# Patient Record
Sex: Female | Born: 1940 | Race: Black or African American | Hispanic: No | Marital: Married | Smoking: Never smoker
Health system: Southern US, Community
[De-identification: ages and names within clinical notes are randomized; demographics above are authoritative.]

## PROBLEM LIST (undated history)

## (undated) DIAGNOSIS — I10 Essential (primary) hypertension: Secondary | ICD-10-CM

## (undated) DIAGNOSIS — J302 Other seasonal allergic rhinitis: Secondary | ICD-10-CM

## (undated) DIAGNOSIS — M1990 Unspecified osteoarthritis, unspecified site: Secondary | ICD-10-CM

## (undated) HISTORY — PX: UTERINE FIBROID SURGERY: SHX826

## (undated) HISTORY — PX: JOINT REPLACEMENT: SHX530

---

## 1998-04-18 ENCOUNTER — Emergency Department (HOSPITAL_COMMUNITY): Admission: EM | Admit: 1998-04-18 | Discharge: 1998-04-18 | Payer: Self-pay | Admitting: Emergency Medicine

## 2000-11-09 ENCOUNTER — Other Ambulatory Visit: Admission: RE | Admit: 2000-11-09 | Discharge: 2000-11-09 | Payer: Self-pay | Admitting: Internal Medicine

## 2001-03-08 ENCOUNTER — Encounter: Admission: RE | Admit: 2001-03-08 | Discharge: 2001-03-08 | Payer: Self-pay

## 2001-09-26 ENCOUNTER — Other Ambulatory Visit: Admission: RE | Admit: 2001-09-26 | Discharge: 2001-09-26 | Payer: Self-pay | Admitting: Obstetrics and Gynecology

## 2001-09-28 ENCOUNTER — Ambulatory Visit (HOSPITAL_COMMUNITY): Admission: RE | Admit: 2001-09-28 | Discharge: 2001-09-28 | Payer: Self-pay | Admitting: Obstetrics and Gynecology

## 2001-09-28 ENCOUNTER — Encounter: Payer: Self-pay | Admitting: Obstetrics and Gynecology

## 2001-10-17 ENCOUNTER — Ambulatory Visit (HOSPITAL_COMMUNITY): Admission: RE | Admit: 2001-10-17 | Discharge: 2001-10-17 | Payer: Self-pay | Admitting: Obstetrics and Gynecology

## 2001-10-17 ENCOUNTER — Encounter: Payer: Self-pay | Admitting: Obstetrics and Gynecology

## 2002-03-27 ENCOUNTER — Encounter: Payer: Self-pay | Admitting: Specialist

## 2002-04-04 ENCOUNTER — Inpatient Hospital Stay (HOSPITAL_COMMUNITY): Admission: RE | Admit: 2002-04-04 | Discharge: 2002-04-09 | Payer: Self-pay | Admitting: Specialist

## 2004-10-23 ENCOUNTER — Ambulatory Visit: Payer: Self-pay | Admitting: Pulmonary Disease

## 2004-10-30 ENCOUNTER — Ambulatory Visit (HOSPITAL_COMMUNITY): Admission: RE | Admit: 2004-10-30 | Discharge: 2004-10-30 | Payer: Self-pay | Admitting: Gastroenterology

## 2005-10-04 ENCOUNTER — Other Ambulatory Visit: Admission: RE | Admit: 2005-10-04 | Discharge: 2005-10-04 | Payer: Self-pay | Admitting: Internal Medicine

## 2007-03-20 ENCOUNTER — Emergency Department (HOSPITAL_COMMUNITY): Admission: EM | Admit: 2007-03-20 | Discharge: 2007-03-20 | Payer: Self-pay | Admitting: Emergency Medicine

## 2007-04-05 ENCOUNTER — Encounter: Admission: RE | Admit: 2007-04-05 | Discharge: 2007-04-05 | Payer: Self-pay | Admitting: Internal Medicine

## 2007-04-19 ENCOUNTER — Other Ambulatory Visit: Admission: RE | Admit: 2007-04-19 | Discharge: 2007-04-19 | Payer: Self-pay | Admitting: Interventional Radiology

## 2007-04-19 ENCOUNTER — Encounter: Admission: RE | Admit: 2007-04-19 | Discharge: 2007-04-19 | Payer: Self-pay | Admitting: Internal Medicine

## 2007-04-19 ENCOUNTER — Encounter (INDEPENDENT_AMBULATORY_CARE_PROVIDER_SITE_OTHER): Payer: Self-pay | Admitting: Interventional Radiology

## 2007-04-20 ENCOUNTER — Encounter: Admission: RE | Admit: 2007-04-20 | Discharge: 2007-07-19 | Payer: Self-pay | Admitting: Orthopedic Surgery

## 2009-02-23 ENCOUNTER — Emergency Department (HOSPITAL_COMMUNITY): Admission: EM | Admit: 2009-02-23 | Discharge: 2009-02-23 | Payer: Self-pay | Admitting: Emergency Medicine

## 2011-02-05 NOTE — Discharge Summary (Signed)
[facility]  Patient:    KIZZI, OVERBEY Visit Number: [id number] MRN: [id number]          Service Type: SUR Location: 4W 0482 01 Attending Physician:  Rocky Crafts Dictated by:   Ralene Bathe, P.A. Admit Date:  04/04/2002 Discharge Date: 04/09/2002   CC:         Helene Kelp, M.D.   Discharge Summary  MEDICAL PHYSICIAN:  W. Chase Picket, M.D.  ADMISSION DIAGNOSES: 1. End-stage rheumatoid arthritis, right knee. 2. Gastroesophageal reflux disease. 3. Postmenopausal.  DISCHARGE DIAGNOSES: 1. End-stage rheumatoid arthritis, right knee, status post right knee    arthroplasty. 2. Gastroesophageal reflux disease. 3. Postmenopausal. 4. Postoperative hemorrhagic anemia, stable.  OPERATIONS:  Right total knee arthroplasty with postoperative epidural for pain control.  Surgeon:  [employer]. Montez Morita, M.D.  Assistant:  Georges Lynch. Gioffre, M.D.  CONSULTS:  None.  HISTORY OF PRESENT ILLNESS:  The patient is a 70 year old female who has end-stage rheumatoid arthritis and has failed outpatient conservative measures of the right knee.  At this time, she is having significant pain and disability and total knee arthroplasty is indicated.  The risks and benefits were discussed with the patient.  She was seen by her medical physicians and they were able to be stopped two weeks prior to surgery.  At this time, she wishes to proceed.  HOSPITAL COURSE:  The patient is admitted and underwent the above-named procedure.  She tolerated this well.  All appropriate IV antibiotics and analgesics.  Postoperatively, she had epidural pain control that was removed by anesthesia without sequelae.  She did extremely well.  She was placed on Coumadin for DVT and PE prophylaxis.  She was weaned to p.o. analgesics and began working with therapy.  All in all, she was noted to have a postoperative hemorrhagic anemia, but did not wish to have transfusion.  She dropped to  7.9 and was stable at 7.9 the following day and was tolerating this with her activities.  On April 09, 2002, she had met all of her therapy goals.  The patient was afebrile.  Her incision was clean and dry.  At this time, she was stable for discharge to home on outpatient iron and other medicines per routine.  LABORATORY DATA:  The admission hemoglobin was 12.3 and postoperatively down to 7.9 on two blood draws, stable.  Pro times and INRs were monitored by pharmacy.  Preoperative chemistries within normal limits.  Urinalysis also normal.  The blood type is O+.  RADIOLOGY:  A chest x-ray with no evidence of acute disease.  Severe rheumatoid arthritis noted in the knee.  The EKG showed normal sinus rhythm and occasional PVCs.  CONDITION ON DISCHARGE:  Stable and improved.  DISPOSITION:  The patient is being discharged to home.  Home PT and R.N. are arranged.  DISCHARGE MEDICATIONS:  She is on Coumadin per protocol.  She is also on Percocet.  She had previously had an allergy to this, however, tolerated this medicine without problems during the hospitalization.  She is to resume her other medications at home.  She was also given Skelaxin 400 mg, #60, one to two every six hours p.r.n. spasm.  Again, Percocet 5/325 mg, #40, one to two every four to six hours p.r.n. pain and Trinsicon one t.i.d.  FOLLOW-UP:  Monitor for any further symptoms of anemia.  Have her follow up in two weeks.  WOUND CARE:  May shower at this time. Dictated by:   Ralene Bathe,  P.A. Attending Physician:  Rocky Crafts DD:  04/09/02 TD:  04/13/02 Job: [id number]

## 2011-02-05 NOTE — Op Note (Signed)
[facility]  Patient:    Lindsey Flynn, Lindsey Flynn Visit Number: [id number] MRN: [id number]          Service Type: SUR Location: 4W 0482 01 Attending Physician:  Rocky Crafts Dictated by:   Michael Litter. Montez Morita, M.D. Proc. Date: 04/04/02 Admit Date:  04/04/2002                             Operative Report  SURGEON:  Philips J. Montez Morita, M.D.  ASSISTANT:  Georges Lynch. Darrelyn Hillock, M.D.  PREOPERATIVE DIAGNOSIS:  Rheumatoid arthritis, right knee with severe degenerative component as well on the lateral side.  POSTOPERATIVE DIAGNOSIS:  Rheumatoid arthritis, right knee with severe degenerative component as well on the lateral side.  OPERATIVE PROCEDURE:  Right total knee arthroplasty using an [employer] Scorpio system, posterior cruciate sacrificing with a size 9 femur, a size 9 tibia, a 12 mm tibial tray, and a 26 mm recessed patella.  Two grams of vancomycin were mixed in two batches of the radiopaque Simplex cement.  DESCRIPTION OF PROCEDURE:  After suitable general anesthesia, the right leg is prepped and draped routinely and, after exsanguination, an upper thigh tourniquet is inflated to 300 mmHg.  Midline incision is made, followed by a lateral parapatellar incision.  An osteotome is used to free up the lateral half of the patella for easier exposure.  On the exposure, a capsular incision is made high and a synovial incision low so that should there be need of a lateral release, these could be sutured together to close the joint.  Severe synovial disease was debrided.  There was some medial laxity which is another reason for the lateral approach.  Spurs and osteophytes were removed, a very narrow notch in the femur.  A central guidepin for the femur, followed by the rod and a 5 degree valgus cut was put into the knee, taking of 10 mm of distal femur.  The new device was brought in, and it was felt that she was in about 4.5 degrees of external rotation.   Elected to use a 9 and then to elevate 2 mm so as to avoid any significant cutting into the femur and using that sizing guide, the anterior and posterior and chamfering cuts were made on the femur for a size 9 prosthesis.  Tibial spines were removed.  Remnants of the menisci were trimmed out.  The cruciates were released.  The tibia was brought forward.  A size 9 was thought to be good.  Central drill hole made, followed by the guide rod, elected to take 10 mm off of the normal side which is the medial size which really was not normal, and that looked to be a bit thick. Dropped that down to about 8 mm and made a 0 degree cut in this rheumatoid with no posterior slope.  Following that, we cleared the back of the femur of osteophytes and remnants of soft tissue and synovium, always using the Bovie as cutting device and to coagulate as well.  The notches were then cut from the femur for the patellar groove in the notch, after which a trial femur was slipped on, a size 9, and we could get a 10 mm tibial tray in, but I wanted a little thicker tray, so we went ahead and cut two more off the tibia so it would handle a 12.  The 12 trial was put in, and the points were marked for the  cutting device to cut the fins for the prosthesis.  Then before doing that, the patella was reamed; 26 mm patella with a recessed patella was reamed down 10 mm, and the holes were reamed in for it as well.  The devices were then all removed.  The tibia was subluxed forward and tibial fin cutting guide was brought in and cut to accommodate the size 9 tibia with the fins.  The wound was then thoroughly irrigated.  Cement was mixed with the vancomycin and cemented to the femur, tibia, and then patella and held until set.  Cement removed off the back of the patella.  Some bone had been impacted up into the femoral groove.  Some bone wax was put over the femoral groove area. Tourniquet was then let down.  Hemostasis secured  after which there was no major bleeding.  The regular 12 tray was inserted and the wound then closed in layers, reattaching the capsule in its normal position laterally.  Without having the need of a lateral release, the patella worked quite well in the groove.  A subcuticular suture in the skin of 3-0 PDS with Steri-Strips.  Nice compression dressing.  Hemovac.  She goes to recovery in good condition. Dictated by:   [employer] Montez Morita, M.D. Attending Physician:  Rocky Crafts DD:  04/04/02 TD:  04/07/02 Job: [id number]

## 2011-02-05 NOTE — H&P (Signed)
[facility]  Patient:    Lindsey Flynn, Lindsey Flynn Visit Number: [id number] MRN: [id number]          Service Type: Attending:  Philips J. Montez Morita, M.D. Dictated by:   Dorie Rank, P.A. Adm. Date:  04/04/02                           History and Physical  DATE OF BIRTH:  1940-12-06.  CHIEF COMPLAINT:  Right knee pain.  HISTORY OF PRESENT ILLNESS:  The patient is a pleasant 70 year old female without has had a history of rheumatoid arthritis since she was a young adult. She has had continued right knee pain despite conservative treatment, severe x2 to 3 years.  Radiographs revealed degenerative changes to the right knee with bone on bone contacts, spurring, and cyst formation.  The pain was to the point where it was interfering with her activities of daily living.  It was felt she would benefit from undergoing a right total knee arthroplasty.  The risks, benefits, as well as the procedures were discussed with the patient. She elected to proceed.  She did obtain a medical clearance from [facility].  ALLERGIES:  She reports that PERCOCET and DEMEROL make her "hyper."  MEDICATIONS: 1. Arava 20 mg 1 p.o. three times weekly. 2. Estradiol 2 mg 1 p.o. q. day. 3. It was recommended by [facility] that she should not    take her arava 2 weeks before and 2 weeks after surgery.  PAST MEDICAL HISTORY: 1. Significant for rheumatoid arthritis. 2. Gastroesophageal reflux disease. 3. Menopausal.  SOCIAL HISTORY:  The patient is married.  She is retired.  She denies any alcohol or tobacco use.  She has two children.  She lives in a 1 level home.  SURGICAL HISTORY:  Left knee arthroscopy and hysterectomy in the distant past.  FAMILY HISTORY:  She has a sister with breast cancer.  Mother deceased age 31. History of Alzheimers.  Father deceased age unknown.  A history of hypertension.  REVIEW OF SYSTEMS:  General:  No fevers,  chills, night sweats.  Pulmonary: Occasional dyspnea on recumbency.  She attributes this to a recent weight gain.  Cardiovascular:  No chest pain, angina or orthopnea.  Endocrine:  No history of hyper or hypothyroidism.  No diabetes mellitus.  GI:  No nausea, vomiting, diarrhea or constipation.  GU:  No hematuria, dysuria or discharge. Psychologic:  No history of anxiety or depression.  Musculoskeletal:  Multiple joint pain secondary to rheumatoid arthritis.  PHYSICAL EXAMINATION:  GENERAL:  Alert, oriented, well-developed, well-nourished 70 year old female appears stated age.  VITAL SIGNS:  Respirations 20, pulse 70, blood pressure 130/90.  HEENT:  Head is normocephalic and atraumatic.  Oropharynx is clear.  NECK:  Supple, negative for cervical lymphadenopathy.  CHEST:  Clear to auscultation bilaterally.  No wheezes, rhonchis or rales.  BREASTS:  No pertinent to present illness.  HEART:  S1, S2.  Negative for murmur, rub or gallop.  Regular rate and rhythm.  ABDOMEN:  Soft, nontender, positive bowel sounds.  GU:  Not pertinent to present illness.  EXTREMITIES:  She has crepitus on range of motion to her right knee.  She walks with an antalgic gait.  SKIN:  Intact.  No rashes or lesions appreciated on examination.  LABORATORY DATA AND X-RAYS:  Preoperative labs and x-rays are pending.  IMPRESSION:  Rheumatoid arthritis to the right knee.  PLAN:  A right total knee  arthroplasty with Dr. Ronnell Guadalajara.  She does voice understanding and it is felt this is anticipated for upcoming surgery. Dictated by:   Dorie Rank, P.A. Attending:  Philips J. Montez Morita, M.D. DD:  03/27/02 TD:  03/29/02 Job: [id number]

## 2011-02-05 NOTE — Op Note (Signed)
NAME:  Lindsey Flynn, Lindsey Flynn                ACCOUNT NO.:  [id number]   MEDICAL RECORD NO.:  [id number]          PATIENT TYPE:  AMB   LOCATION:  ENDO                         FACILITY:  [facility]   PHYSICIAN:  Anselmo Rod, M.D.  DATE OF BIRTH:  1941/04/25   DATE OF PROCEDURE:  10/30/2004  DATE OF DISCHARGE:                                 OPERATIVE REPORT   PROCEDURE:  Screening colonoscopy.   ENDOSCOPIST:  Anselmo Rod, M.D.   INSTRUMENT:  Olympus video colonoscope.   INDICATIONS FOR PROCEDURE:  A 70 year old African-American female underwent  screening colonoscopy to rule out colonic polyps, masses, etc.   PREPROCEDURE PREPARATION:  Informed consent was procured from the patient.  The patient was fasted for 8 hours prior to the procedure and prepped with a  bottle of magnesium citrate in a gallon of GoLYTELY the night prior to the  procedure.  The risks and benefits of the procedure including a 10% miss  rate of cancers and polyps were discussed with her as well.   PREPROCEDURE PHYSICAL:  VITAL SIGNS: The patient with stable vital signs.  NECK:  Supple.  CHEST:  Clear to auscultation.  HEART:  S1-S2 regular.  ABDOMEN:  Soft with normal bowel sounds.   DESCRIPTION OF PROCEDURE:  The patient was placed in the left lateral  decubitus position and sedated with 70 mg of Demerol and 7.5 mg of Versed in  incremental doses.  Once the patient was adequately sedated, maintained on  low-flow oxygen, and continuous cardiac monitoring; the Olympus video  colonoscope was advanced into the rectum, into the cecum, ileocecal valve  were easily reached and photographed.  No masses, polyps, erosions,  ulcerations or diverticula were seen.  Small internal hemorrhoids were  appreciated on retroflexion.   The patient tolerated the procedure well without immediate complications.   IMPRESSION:  Normal colonoscopy up to the cecum except for small internal  hemorrhoids.  No masses, polyps, or diverticula  were seen.   RECOMMENDATIONS:  1.  A repeat colonoscopy is recommended in the next 5 years unless the      patient develops abnormal symptoms in      the interim.  2.  High fiber diet with liberal fluid intake has been advised.  3.  Outpatient follow up as need arises in the future.      JNM/MEDQ  D:  10/30/2004  T:  11/01/2004  Job:  [id number]   cc:   Georgianne Fick, M.D.  [address]  [place]  [place] [address]  Fax: [phone]   Garth Schlatter, M.D.

## 2011-07-07 LAB — POCT CARDIAC MARKERS: Troponin i, poc: <0.05

## 2012-02-04 ENCOUNTER — Emergency Department (HOSPITAL_COMMUNITY)
Admission: EM | Admit: 2012-02-04 | Discharge: 2012-02-04 | Disposition: A | Discharge status: Home / Self Care | Payer: Medicare Other | Attending: Emergency Medicine | Admitting: Emergency Medicine

## 2012-02-04 ENCOUNTER — Emergency Department (HOSPITAL_COMMUNITY): Payer: Medicare Other

## 2012-02-04 ENCOUNTER — Encounter (HOSPITAL_COMMUNITY): Payer: Self-pay

## 2012-02-04 DIAGNOSIS — M129 Arthropathy, unspecified: Secondary | ICD-10-CM | POA: Insufficient documentation

## 2012-02-04 DIAGNOSIS — Z96659 Presence of unspecified artificial knee joint: Secondary | ICD-10-CM | POA: Insufficient documentation

## 2012-02-04 DIAGNOSIS — Z79899 Other long term (current) drug therapy: Secondary | ICD-10-CM | POA: Insufficient documentation

## 2012-02-04 DIAGNOSIS — W010XXA Fall on same level from slipping, tripping and stumbling without subsequent striking against object, initial encounter: Secondary | ICD-10-CM | POA: Insufficient documentation

## 2012-02-04 DIAGNOSIS — M25569 Pain in unspecified knee: Secondary | ICD-10-CM | POA: Insufficient documentation

## 2012-02-04 DIAGNOSIS — IMO0002 Reserved for concepts with insufficient information to code with codable children: Secondary | ICD-10-CM | POA: Insufficient documentation

## 2012-02-04 DIAGNOSIS — I10 Essential (primary) hypertension: Secondary | ICD-10-CM | POA: Insufficient documentation

## 2012-02-04 HISTORY — DX: Unspecified osteoarthritis, unspecified site: M19.90

## 2012-02-04 HISTORY — DX: Essential (primary) hypertension: I10

## 2012-02-04 MED ORDER — HYDROCODONE-ACETAMINOPHEN 5-500 MG PO TABS: 1.0000 | ORAL_TABLET | Freq: Four times a day (QID) | ORAL | Status: AC | PRN

## 2012-02-04 MED ORDER — IBUPROFEN 200 MG PO TABS
600.0000 mg | ORAL_TABLET | Freq: Once | ORAL | Status: AC
  Administered 2012-02-04: 600 mg via ORAL
  Filled 2012-02-04: qty 3

## 2012-02-04 MED ORDER — IBUPROFEN 400 MG PO TABS: 600.0000 mg | ORAL_TABLET | Freq: Four times a day (QID) | ORAL | Status: AC | PRN

## 2012-02-04 NOTE — ED Notes (Addendum)
Per EMS, pt slipped on grapes and fell in grocery store, landed on knee, now with right knee pain.  Pt also reports h/o pain to same knee.  Fall was witnessed, pt did not hit head or have any other injuries.

## 2012-02-04 NOTE — ED Notes (Signed)
Patient reports that she slipped on grapes in a grocery store landing on both knees,but right knee hurts worse. Patient reports limited weightbearing due to pain.

## 2012-02-04 NOTE — ED Provider Notes (Signed)
History     CSN: [id number]  Arrival date & time 02/04/12  1539   First MD Initiated Contact with Patient 02/04/12 1626      Chief Complaint  Patient presents with  . Knee Pain  . Fall    (Consider location/radiation/quality/duration/timing/severity/associated sxs/prior treatment) Patient is a 71 y.o. female presenting with fall. The history is provided by the patient.  Fall The accident occurred 1 to 2 hours ago. The fall occurred while walking. She landed on a hard floor. The point of impact was the left knee and right knee. The pain is present in the left knee and right knee. The pain is at a severity of 7/10. The pain is moderate. She was ambulatory at the scene. There was no entrapment after the fall.  Pt states she fell today after slipping on a grape at a grocery store. States pain in bilateral knees, states fell on top of them. States hx of right knee replacement 10 years ago. Hx of rheumatoid arthritis. No other complaints. Did not hit her head. No LOC. States unable to put any weight on it, pt did ambulate some, but states it is unbearable.   Past Medical History  Diagnosis Date  . Hypertension   . Arthritis     Past Surgical History  Procedure Date  . Joint replacement   . Uterine fibroid surgery     Family History  Problem Relation Age of Onset  . Cancer Sister   . Diabetes Other     History  Substance Use Topics  . Smoking status: Never Smoker   . Smokeless tobacco: Never Used  . Alcohol Use: No    OB History    Grav Para Term Preterm Abortions TAB SAB Ect Mult Living                  Review of Systems  Allergies  Review of patient's allergies indicates no known allergies.  Home Medications   Current Outpatient Rx  Name Route Sig Dispense Refill  . ESTRADIOL 2 MG PO TABS Oral Take 2 mg by mouth daily.    Marland Kitchen LISINOPRIL-HYDROCHLOROTHIAZIDE 20-12.5 MG PO TABS Oral Take 1 tablet by mouth daily.      BP 150/85  Pulse 83  Temp(Src) 99.1 F  (37.3 C) (Oral)  Resp 18  SpO2 100%  Physical Exam  Nursing note and vitals reviewed. Constitutional: She is oriented to person, place, and time. She appears well-developed and well-nourished.  HENT:  Head: Normocephalic and atraumatic.  Eyes: Conjunctivae are normal.  Neck: Neck supple.  Cardiovascular: Normal rate, regular rhythm and normal heart sounds.   Pulmonary/Chest: Effort normal and breath sounds normal. No respiratory distress.  Musculoskeletal:       Normal appearing bilateral knees. Right knee has large healed surgical incision through it from prior knee replacement. Tender over bilateral patellas. Patella tendons intact. Full ROM bilaterally on both knee joints, however, pain with full extension and full flexion. Knee joints are stable with negative anterior and posterior drawer signs.   Neurological: She is alert and oriented to person, place, and time.  Skin: Skin is warm and dry.  Psychiatric: She has a normal mood and affect.    ED Course  Procedures (including critical care time)  Pt fell on her bilat knees. Pain with bearing weight. Will get x-rays.   Dg Knee Complete 4 Views Left  02/04/2012  *RADIOLOGY REPORT*  Clinical Data: Post fall landing on knees, anterior knee pain bilaterally, history arthritis  LEFT KNEE - COMPLETE 4+ VIEW  Comparison: None  Findings: Osseous demineralization. Diffuse joint space narrowing and marginal spur formation. No acute fracture, dislocation or bone destruction. No definite knee joint effusion.  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 No acute abnormalities.  Original Report Authenticated By: Lollie Marrow, M.D.   Dg Knee Complete 4 Views Right  02/04/2012  *RADIOLOGY REPORT*  Clinical Data: Larey Seat landing on knees, bilateral anterior knee pain  RIGHT KNEE - COMPLETE 4+ VIEW  Comparison: 03/20/2007  Findings: Components of right knee prosthesis in expected positions. Bones appear  demineralized. No periprosthetic lucency. Minimal bony debris posteriorly. No acute fracture, dislocation or bone destruction. Spurring at inferior pole patella. No definite knee joint effusion.  IMPRESSION: Osseous demineralization. Right knee prosthesis. No acute abnormalities.  Original Report Authenticated By: Lollie Marrow, M.D.    X-rays negative. Will treat with pain medications. Pt refusing meds in ED. Pt ambulated. Will d/c home. I do not suspect major ligament injury. Joints are stable.   1. Knee sprain and strain bilateral       MDM          Lottie Mussel, PA 02/05/12 [phone]

## 2012-02-04 NOTE — Discharge Instructions (Signed)
Your x-ray look normal. Ice your knees at home. Stay off of them. Keep elevated. Ibuprofen for pain. Vocodin as prescribed as needed for severe pain, do not drive if taking. Follow up with orthopedics as referred if not improving.   Knee Pain The knee is the complex joint between your thigh and your lower leg. It is made up of bones, tendons, ligaments, and cartilage. The bones that make up the knee are:  The femur in the thigh.   The tibia and fibula in the lower leg.   The patella or kneecap riding in the groove on the lower femur.  CAUSES  Knee pain is a common complaint with many causes. A few of these causes are:  Injury, such as:   A ruptured ligament or tendon injury.   Torn cartilage.   Medical conditions, such as:   Gout   Arthritis   Infections   Overuse, over training or overdoing a physical activity.  Knee pain can be minor or severe. Knee pain can accompany debilitating injury. Minor knee problems often respond well to self-care measures or get well on their own. More serious injuries may need medical intervention or even surgery. SYMPTOMS The knee is complex. Symptoms of knee problems can vary widely. Some of the problems are:  Pain with movement and weight bearing.   Swelling and tenderness.   Buckling of the knee.   Inability to straighten or extend your knee.   Your knee locks and you cannot straighten it.   Warmth and redness with pain and fever.   Deformity or dislocation of the kneecap.  DIAGNOSIS  Determining what is wrong may be very straight forward such as when there is an injury. It can also be challenging because of the complexity of the knee. Tests to make a diagnosis may include:  Your caregiver taking a history and doing a physical exam.   Routine X-rays can be used to rule out other problems. X-rays will not reveal a cartilage tear. Some injuries of the knee can be diagnosed by:   Arthroscopy a surgical technique by which a small video  camera is inserted through tiny incisions on the sides of the knee. This procedure is used to examine and repair internal knee joint problems. Tiny instruments can be used during arthroscopy to repair the torn knee cartilage (meniscus).   Arthrography is a radiology technique. A contrast liquid is directly injected into the knee joint. Internal structures of the knee joint then become visible on X-ray film.   An MRI scan is a non x-ray radiology procedure in which magnetic fields and a computer produce two- or three-dimensional images of the inside of the knee. Cartilage tears are often visible using an MRI scanner. MRI scans have largely replaced arthrography in diagnosing cartilage tears of the knee.   Blood work.   Examination of the fluid that helps to lubricate the knee joint (synovial fluid). This is done by taking a sample out using a needle and a syringe.  TREATMENT The treatment of knee problems depends on the cause. Some of these treatments are:  Depending on the injury, proper casting, splinting, surgery or physical therapy care will be needed.   Give yourself adequate recovery time. Do not overuse your joints. If you begin to get sore during workout routines, back off. Slow down or do fewer repetitions.   For repetitive activities such as cycling or running, maintain your strength and nutrition.   Alternate muscle groups. For example if you are  a weight lifter, work the upper body on one day and the lower body the next.   Either tight or weak muscles do not give the proper support for your knee. Tight or weak muscles do not absorb the stress placed on the knee joint. Keep the muscles surrounding the knee strong.   Take care of mechanical problems.   If you have flat feet, orthotics or special shoes may help. See your caregiver if you need help.   Arch supports, sometimes with wedges on the inner or outer aspect of the heel, can help. These can shift pressure away from the side  of the knee most bothered by osteoarthritis.   A brace called an "unloader" brace also may be used to help ease the pressure on the most arthritic side of the knee.   If your caregiver has prescribed crutches, braces, wraps or ice, use as directed. The acronym for this is PRICE. This means protection, rest, ice, compression and elevation.   Nonsteroidal anti-inflammatory drugs (NSAID's), can help relieve pain. But if taken immediately after an injury, they may actually increase swelling. Take NSAID's with food in your stomach. Stop them if you develop stomach problems. Do not take these if you have a history of ulcers, stomach pain or bleeding from the bowel. Do not take without your caregiver's approval if you have problems with fluid retention, heart failure, or kidney problems.   For ongoing knee problems, physical therapy may be helpful.   Glucosamine and chondroitin are over-the-counter dietary supplements. Both may help relieve the pain of osteoarthritis in the knee. These medicines are different from the usual anti-inflammatory drugs. Glucosamine may decrease the rate of cartilage destruction.   Injections of a corticosteroid drug into your knee joint may help reduce the symptoms of an arthritis flare-up. They may provide pain relief that lasts a few months. You may have to wait a few months between injections. The injections do have a small increased risk of infection, water retention and elevated blood sugar levels.   Hyaluronic acid injected into damaged joints may ease pain and provide lubrication. These injections may work by reducing inflammation. A series of shots may give relief for as long as 6 months.   Topical painkillers. Applying certain ointments to your skin may help relieve the pain and stiffness of osteoarthritis. Ask your pharmacist for suggestions. Many over the-counter products are approved for temporary relief of arthritis pain.   In some countries, doctors often  prescribe topical NSAID's for relief of chronic conditions such as arthritis and tendinitis. A review of treatment with NSAID creams found that they worked as well as oral medications but without the serious side effects.  PREVENTION  Maintain a healthy weight. Extra pounds put more strain on your joints.   Get strong, stay limber. Weak muscles are a common cause of knee injuries. Stretching is important. Include flexibility exercises in your workouts.   Be smart about exercise. If you have osteoarthritis, chronic knee pain or recurring injuries, you may need to change the way you exercise. This does not mean you have to stop being active. If your knees ache after jogging or playing basketball, consider switching to swimming, water aerobics or other low-impact activities, at least for a few days a week. Sometimes limiting high-impact activities will provide relief.   Make sure your shoes fit well. Choose footwear that is right for your sport.   Protect your knees. Use the proper gear for knee-sensitive activities. Use kneepads when playing volleyball  or laying carpet. Buckle your seat belt every time you drive. Most shattered kneecaps occur in car accidents.   Rest when you are tired.  SEEK MEDICAL CARE IF:  You have knee pain that is continual and does not seem to be getting better.  SEEK IMMEDIATE MEDICAL CARE IF:  Your knee joint feels hot to the touch and you have a high fever. MAKE SURE YOU:   Understand these instructions.   Will watch your condition.   Will get help right away if you are not doing well or get worse.  Document Released: 07/04/2007 Document Revised: 08/26/2011 Document Reviewed: 07/04/2007 [facility] Patient Information 2012 [place], [place].

## 2012-02-07 NOTE — ED Provider Notes (Signed)
Medical screening examination/treatment/procedure(s) were conducted as a shared visit with non-physician practitioner(s) and myself.  I personally evaluated the patient during the encounter S/p slip at store. Contusion knee. Mild swelling, knee stable.   Suzi Roots, MD 02/07/12 [phone]

## 2014-01-14 ENCOUNTER — Emergency Department (HOSPITAL_COMMUNITY): Payer: No Typology Code available for payment source

## 2014-01-14 ENCOUNTER — Encounter (HOSPITAL_COMMUNITY): Payer: Self-pay | Admitting: Radiology

## 2014-01-14 ENCOUNTER — Encounter (HOSPITAL_COMMUNITY)
Admission: EM | Disposition: A | Discharge status: Skilled Nursing Facility | Payer: Self-pay | Source: Home / Self Care | Attending: Orthopedic Surgery

## 2014-01-14 ENCOUNTER — Inpatient Hospital Stay (HOSPITAL_COMMUNITY)
Admission: EM | Admit: 2014-01-14 | Discharge: 2014-01-23 | DRG: 480 | Disposition: A | Discharge status: Skilled Nursing Facility | Payer: No Typology Code available for payment source | Attending: Orthopedic Surgery | Admitting: Orthopedic Surgery

## 2014-01-14 DIAGNOSIS — Y831 Surgical operation with implant of artificial internal device as the cause of abnormal reaction of the patient, or of later complication, without mention of misadventure at the time of the procedure: Secondary | ICD-10-CM | POA: Diagnosis present

## 2014-01-14 DIAGNOSIS — W208XXA Other cause of strike by thrown, projected or falling object, initial encounter: Secondary | ICD-10-CM | POA: Diagnosis present

## 2014-01-14 DIAGNOSIS — T84049A Periprosthetic fracture around unspecified internal prosthetic joint, initial encounter: Secondary | ICD-10-CM | POA: Diagnosis present

## 2014-01-14 DIAGNOSIS — S72409B Unspecified fracture of lower end of unspecified femur, initial encounter for open fracture type I or II: Secondary | ICD-10-CM | POA: Diagnosis present

## 2014-01-14 DIAGNOSIS — R339 Retention of urine, unspecified: Secondary | ICD-10-CM | POA: Diagnosis not present

## 2014-01-14 DIAGNOSIS — Z9660 Presence of unspecified orthopedic joint implant: Principal | ICD-10-CM | POA: Diagnosis present

## 2014-01-14 DIAGNOSIS — S72401B Unspecified fracture of lower end of right femur, initial encounter for open fracture type I or II: Secondary | ICD-10-CM | POA: Diagnosis present

## 2014-01-14 DIAGNOSIS — M129 Arthropathy, unspecified: Secondary | ICD-10-CM | POA: Diagnosis present

## 2014-01-14 DIAGNOSIS — I10 Essential (primary) hypertension: Secondary | ICD-10-CM | POA: Diagnosis present

## 2014-01-14 DIAGNOSIS — Z96659 Presence of unspecified artificial knee joint: Secondary | ICD-10-CM

## 2014-01-14 DIAGNOSIS — S7291XB Unspecified fracture of right femur, initial encounter for open fracture type I or II: Secondary | ICD-10-CM

## 2014-01-14 DIAGNOSIS — S7290XB Unspecified fracture of unspecified femur, initial encounter for open fracture type I or II: Secondary | ICD-10-CM | POA: Diagnosis present

## 2014-01-14 DIAGNOSIS — D62 Acute posthemorrhagic anemia: Secondary | ICD-10-CM | POA: Diagnosis not present

## 2014-01-14 DIAGNOSIS — M810 Age-related osteoporosis without current pathological fracture: Secondary | ICD-10-CM | POA: Diagnosis present

## 2014-01-14 DIAGNOSIS — M25569 Pain in unspecified knee: Secondary | ICD-10-CM | POA: Diagnosis present

## 2014-01-14 HISTORY — PX: IM NAILING FEMORAL SHAFT FRACTURE: SUR731

## 2014-01-14 HISTORY — DX: Other seasonal allergic rhinitis: J30.2

## 2014-01-14 HISTORY — PX: FEMUR IM NAIL: SHX1597

## 2014-01-14 LAB — COMPREHENSIVE METABOLIC PANEL
ALT: 9 U/L (ref 0–35)
AST: 16 U/L (ref 0–37)
Albumin: 3.3 g/dL — ABNORMAL LOW (ref 3.5–5.2)
Alkaline Phosphatase: 91 U/L (ref 39–117)
BUN: 18 mg/dL (ref 6–23)
CALCIUM: 9.1 mg/dL (ref 8.4–10.5)
CO2: 27 mEq/L (ref 19–32)
CREATININE: 0.81 mg/dL (ref 0.50–1.10)
Chloride: 101 mEq/L (ref 96–112)
GFR calc non Af Amer: 72 mL/min — ABNORMAL LOW (ref >90)
GFR, EST AFRICAN AMERICAN: 83 mL/min — AB (ref >90)
GLUCOSE: 134 mg/dL — AB (ref 70–99)
Potassium: 4.3 mEq/L (ref 3.7–5.3)
Sodium: 140 mEq/L (ref 137–147)
TOTAL PROTEIN: 7.6 g/dL (ref 6.0–8.3)
Total Bilirubin: 0.2 mg/dL — ABNORMAL LOW (ref 0.3–1.2)

## 2014-01-14 LAB — CBC
HEMATOCRIT: 36.8 % (ref 36.0–46.0)
HEMOGLOBIN: 11.4 g/dL — AB (ref 12.0–15.0)
MCH: 27.1 pg (ref 26.0–34.0)
MCHC: 31 g/dL (ref 30.0–36.0)
MCV: 87.6 fL (ref 78.0–100.0)
Platelets: 246 10*3/uL (ref 150–400)
RBC: 4.2 MIL/uL (ref 3.87–5.11)
RDW: 15.3 % (ref 11.5–15.5)
WBC: 9.7 10*3/uL (ref 4.0–10.5)

## 2014-01-14 LAB — SAMPLE TO BLOOD BANK

## 2014-01-14 LAB — PROTIME-INR
INR: 0.94 (ref 0.00–1.49)
Prothrombin Time: 12.4 seconds (ref 11.6–15.2)

## 2014-01-14 SURGERY — INSERTION, INTRAMEDULLARY ROD, FEMUR, RETROGRADE
Anesthesia: General | Site: Leg Upper | Laterality: Right

## 2014-01-14 MED ORDER — SUCCINYLCHOLINE CHLORIDE 20 MG/ML IJ SOLN
INTRAMUSCULAR | Status: AC
  Filled 2014-01-14: qty 1

## 2014-01-14 MED ORDER — ONDANSETRON HCL 4 MG/2ML IJ SOLN
INTRAMUSCULAR | Status: AC
  Filled 2014-01-14: qty 2

## 2014-01-14 MED ORDER — TETANUS-DIPHTH-ACELL PERTUSSIS 5-2.5-18.5 LF-MCG/0.5 IM SUSP
0.5000 mL | Freq: Once | INTRAMUSCULAR | Status: AC
  Administered 2014-01-14: 0.5 mL via INTRAMUSCULAR
  Filled 2014-01-14: qty 0.5

## 2014-01-14 MED ORDER — LIDOCAINE HCL (CARDIAC) 20 MG/ML IV SOLN
INTRAVENOUS | Status: AC
  Filled 2014-01-14: qty 5

## 2014-01-14 MED ORDER — HYDROMORPHONE HCL PF 1 MG/ML IJ SOLN
1.0000 mg | INTRAMUSCULAR | Status: AC | PRN
  Administered 2014-01-14: 1 mg via INTRAVENOUS
  Administered 2014-01-14 (×2): 0.5 mg via INTRAVENOUS
  Filled 2014-01-14 (×2): qty 1

## 2014-01-14 MED ORDER — ONDANSETRON HCL 4 MG/2ML IJ SOLN
4.0000 mg | Freq: Once | INTRAMUSCULAR | Status: AC
  Administered 2014-01-14: 4 mg via INTRAVENOUS

## 2014-01-14 MED ORDER — CEFAZOLIN SODIUM-DEXTROSE 2-3 GM-% IV SOLR
2.0000 g | Freq: Once | INTRAVENOUS | Status: AC
  Administered 2014-01-14: 2 g via INTRAVENOUS
  Filled 2014-01-14: qty 50

## 2014-01-14 MED ORDER — HYDROMORPHONE HCL PF 1 MG/ML IJ SOLN
1.0000 mg | Freq: Once | INTRAMUSCULAR | Status: AC
  Administered 2014-01-14: 1 mg via INTRAVENOUS

## 2014-01-14 MED ORDER — HYDROMORPHONE HCL PF 1 MG/ML IJ SOLN
1.0000 mg | Freq: Once | INTRAMUSCULAR | Status: AC
  Administered 2014-01-14: 1 mg via INTRAVENOUS
  Filled 2014-01-14: qty 1

## 2014-01-14 MED ORDER — LACTATED RINGERS IV SOLN
INTRAVENOUS | Status: DC | PRN
  Administered 2014-01-14 – 2014-01-15 (×2): via INTRAVENOUS

## 2014-01-14 MED ORDER — ARTIFICIAL TEARS OP OINT
TOPICAL_OINTMENT | OPHTHALMIC | Status: AC
  Filled 2014-01-14: qty 3.5

## 2014-01-14 MED ORDER — HYDROMORPHONE HCL PF 1 MG/ML IJ SOLN
INTRAMUSCULAR | Status: AC
  Filled 2014-01-14: qty 1

## 2014-01-14 MED ORDER — PHENYLEPHRINE 40 MCG/ML (10ML) SYRINGE FOR IV PUSH (FOR BLOOD PRESSURE SUPPORT)
PREFILLED_SYRINGE | INTRAVENOUS | Status: AC
  Filled 2014-01-14: qty 10

## 2014-01-14 MED ORDER — FENTANYL CITRATE 0.05 MG/ML IJ SOLN
INTRAMUSCULAR | Status: AC
  Filled 2014-01-14: qty 5

## 2014-01-14 SURGICAL SUPPLY — 71 items
BANDAGE ELASTIC 4 VELCRO ST LF (GAUZE/BANDAGES/DRESSINGS) ×2 IMPLANT
BANDAGE ELASTIC 6 VELCRO ST LF (GAUZE/BANDAGES/DRESSINGS) ×2 IMPLANT
BANDAGE ESMARK 6X9 LF (GAUZE/BANDAGES/DRESSINGS) IMPLANT
BANDAGE GAUZE ELAST BULKY 4 IN (GAUZE/BANDAGES/DRESSINGS) ×2 IMPLANT
BIT DRILL CALIBRATED 4.3MMX365 (DRILL) ×1 IMPLANT
BIT DRILL CROWE PNT TWST 4.5MM (DRILL) ×1 IMPLANT
BLADE 10 SAFETY STRL DISP (BLADE) ×2 IMPLANT
BLADE SURG 15 STRL LF DISP TIS (BLADE) ×1 IMPLANT
BLADE SURG 15 STRL SS (BLADE) ×2
BLADE SURG ROTATE 9660 (MISCELLANEOUS) IMPLANT
BNDG COHESIVE 6X5 TAN STRL LF (GAUZE/BANDAGES/DRESSINGS) ×2 IMPLANT
BNDG ESMARK 6X9 LF (GAUZE/BANDAGES/DRESSINGS)
CONT SPEC 4OZ CLIKSEAL STRL BL (MISCELLANEOUS) ×2 IMPLANT
COVER SURGICAL LIGHT HANDLE (MISCELLANEOUS) ×4 IMPLANT
CUFF TOURNIQUET SINGLE 34IN LL (TOURNIQUET CUFF) IMPLANT
CUFF TOURNIQUET SINGLE 44IN (TOURNIQUET CUFF) IMPLANT
DRAPE C-ARM 42X72 X-RAY (DRAPES) ×2 IMPLANT
DRAPE C-ARMOR (DRAPES) ×2 IMPLANT
DRAPE ORTHO SPLIT 77X108 STRL (DRAPES) ×2
DRAPE PROXIMA HALF (DRAPES) IMPLANT
DRAPE SURG ORHT 6 SPLT 77X108 (DRAPES) ×2 IMPLANT
DRAPE U-SHAPE 47X51 STRL (DRAPES) ×2 IMPLANT
DRILL CALIBRATED 4.3MMX365 (DRILL) ×2
DRILL CROWE POINT TWIST 4.5MM (DRILL) ×2
DRSG ADAPTIC 3X8 NADH LF (GAUZE/BANDAGES/DRESSINGS) ×2 IMPLANT
DRSG PAD ABDOMINAL 8X10 ST (GAUZE/BANDAGES/DRESSINGS) ×2 IMPLANT
DURAPREP 26ML APPLICATOR (WOUND CARE) IMPLANT
ELECT REM PT RETURN 9FT ADLT (ELECTROSURGICAL) ×2
ELECTRODE REM PT RTRN 9FT ADLT (ELECTROSURGICAL) ×1 IMPLANT
GAUZE XEROFORM 5X9 LF (GAUZE/BANDAGES/DRESSINGS) ×2 IMPLANT
GLOVE BIOGEL PI IND STRL 7.5 (GLOVE) ×1 IMPLANT
GLOVE BIOGEL PI IND STRL 8 (GLOVE) ×1 IMPLANT
GLOVE BIOGEL PI INDICATOR 7.5 (GLOVE) ×1
GLOVE BIOGEL PI INDICATOR 8 (GLOVE) ×1
GLOVE ORTHO TXT STRL SZ7.5 (GLOVE) ×2 IMPLANT
GLOVE SURG ORTHO 8.0 STRL STRW (GLOVE) ×2 IMPLANT
GOWN STRL REUS W/ TWL LRG LVL3 (GOWN DISPOSABLE) ×2 IMPLANT
GOWN STRL REUS W/TWL LRG LVL3 (GOWN DISPOSABLE) ×4
GUIDEWIRE BEAD TIP (WIRE) ×2 IMPLANT
IMMOBILIZER KNEE 22 (SOFTGOODS) ×2 IMPLANT
KIT BASIN OR (CUSTOM PROCEDURE TRAY) ×2 IMPLANT
KIT ROOM TURNOVER OR (KITS) ×2 IMPLANT
MANIFOLD NEPTUNE II (INSTRUMENTS) ×4 IMPLANT
NAIL FEM RETRO 13.5X280 (Nail) ×2 IMPLANT
NEEDLE 22X1 1/2 (OR ONLY) (NEEDLE) IMPLANT
NS IRRIG 1000ML POUR BTL (IV SOLUTION) ×2 IMPLANT
PACK GENERAL/GYN (CUSTOM PROCEDURE TRAY) ×2 IMPLANT
PAD ARMBOARD 7.5X6 YLW CONV (MISCELLANEOUS) ×4 IMPLANT
PIN GUIDE 3.2 903003004 (MISCELLANEOUS) ×2 IMPLANT
SCREW CORT TI DBL LEAD 5X30 (Screw) ×2 IMPLANT
SCREW CORT TI DBL LEAD 5X60 (Screw) ×2 IMPLANT
SCREW CORT TI DBL LEAD 5X65 (Screw) ×2 IMPLANT
SCREW CORT TI DBL LEAD 5X75 (Screw) ×2 IMPLANT
SCREW CORT TI DBL LEAD 5X80 (Screw) ×2 IMPLANT
SCREW CORT TI DBL LEAD 5X85 (Screw) ×2 IMPLANT
SPONGE GAUZE 4X4 12PLY (GAUZE/BANDAGES/DRESSINGS) ×4 IMPLANT
SPONGE GAUZE 4X4 12PLY STER LF (GAUZE/BANDAGES/DRESSINGS) ×2 IMPLANT
SPONGE LAP 18X18 X RAY DECT (DISPOSABLE) ×4 IMPLANT
STAPLER VISISTAT 35W (STAPLE) ×4 IMPLANT
STOCKINETTE IMPERVIOUS LG (DRAPES) ×2 IMPLANT
SUT VIC AB 0 CTB1 27 (SUTURE) IMPLANT
SUT VIC AB 1 CT1 27 (SUTURE) ×6
SUT VIC AB 1 CT1 27XBRD ANBCTR (SUTURE) ×6 IMPLANT
SUT VIC AB 2-0 CT1 27 (SUTURE) ×6
SUT VIC AB 2-0 CT1 TAPERPNT 27 (SUTURE) ×3 IMPLANT
SUT VIC AB 2-0 CTB1 (SUTURE) IMPLANT
SYR CONTROL 10ML LL (SYRINGE) ×2 IMPLANT
TOWEL OR 17X24 6PK STRL BLUE (TOWEL DISPOSABLE) ×2 IMPLANT
TOWEL OR 17X26 10 PK STRL BLUE (TOWEL DISPOSABLE) ×2 IMPLANT
TRAY FOLEY CATH 14FR (SET/KITS/TRAYS/PACK) ×2 IMPLANT
WATER STERILE IRR 1000ML POUR (IV SOLUTION) IMPLANT

## 2014-01-14 NOTE — Progress Notes (Signed)
Orthopedic Tech Progress Note Patient Details:  Lindsey SpillersJean E Seely [birth year] [id number]  Ortho Devices Type of Ortho Device: Knee Immobilizer Ortho Device/Splint Location: RLE Ortho Device/Splint Interventions: Ordered;Application   Jennye MoccasinAnthony Craig Victorina Kable 01/14/2014, 9:46 PM

## 2014-01-14 NOTE — ED Notes (Addendum)
Pt was walking at [employer] when part of a retaining wall fell and landed on her. Pt has open fracture to leg. Pt received 200 mcg of fentanyl with EMS. Pt immobilized. Denies any other injury.

## 2014-01-14 NOTE — ED Notes (Signed)
Radiology called and states that pt is being transport back to department

## 2014-01-14 NOTE — ED Provider Notes (Signed)
CSN: [id number]     Arrival date & time 01/14/14  1627 History   First MD Initiated Contact with Patient 01/14/14 1638     Chief Complaint  Patient presents with  . Leg Injury  . Trauma   Level V caveat: Level II trauma code  (Consider location/radiation/quality/duration/timing/severity/associated sxs/prior Treatment) The history is provided by the patient and the EMS personnel.   history of present illness: 73 year old female who presents via EMS for a right open femur fracture. [place] the mall today when a retaining wall fell onto her legs. Patient denies hitting her head or losing consciousness. She currently complains only of pain in the right leg. Pain is constant, rated severe. She denies any loss of sensation. Patient has history of prior right knee replacement. EMS reports seeing a piece of bone sticking out of the distal anterior right thigh.  Past Medical History  Diagnosis Date  . Hypertension   . Arthritis    Past Surgical History  Procedure Laterality Date  . Joint replacement    . Uterine fibroid surgery     Family History  Problem Relation Age of Onset  . Cancer Sister   . Diabetes Other    History  Substance Use Topics  . Smoking status: Never Smoker   . Smokeless tobacco: Never Used  . Alcohol Use: No   OB History   Grav Para Term Preterm Abortions TAB SAB Ect Mult Living                 Review of Systems  Constitutional: Negative for fever and chills.  HENT: Negative for congestion.   Eyes: Negative for pain.  Respiratory: Negative for shortness of breath.   Cardiovascular: Negative for chest pain.  Gastrointestinal: Negative for nausea, vomiting, abdominal pain, diarrhea and constipation.  Genitourinary: Negative for dysuria.  Musculoskeletal: Negative for back pain.       Right leg pain  Skin: Positive for wound. Negative for rash.  Neurological: Negative for headaches.  All other systems reviewed and are negative.     Allergies   Review of patient's allergies indicates no known allergies.  Home Medications   Prior to Admission medications   Medication Sig Start Date End Date Taking? Authorizing Provider  estradiol (ESTRACE) 2 MG tablet Take 2 mg by mouth daily.    Historical Provider, MD  lisinopril-hydrochlorothiazide (PRINZIDE,ZESTORETIC) 20-12.5 MG per tablet Take 1 tablet by mouth daily.    Historical Provider, MD   BP 135/82  Pulse 93  Temp(Src) 99 F (37.2 C) (Oral)  Resp 13  Ht 5\' 7"  (1.702 m)  Wt 250 lb (113.399 kg)  BMI 39.15 kg/m2  SpO2 100% Physical Exam  Nursing note and vitals reviewed. Constitutional: She is oriented to person, place, and time. She appears well-developed and well-nourished. Cervical collar and backboard in place.  HENT:  Head: Normocephalic and atraumatic.  Eyes: EOM are normal. Pupils are equal, round, and reactive to light.  Neck: Neck supple.  Cardiovascular: Regular rhythm.  Tachycardia present.   No murmur heard. Pulses:      Dorsalis pedis pulses are 2+ on the right side.       Posterior tibial pulses are 1+ on the right side.  Pulmonary/Chest: Effort normal and breath sounds normal. She has no decreased breath sounds. She exhibits no tenderness.  Abdominal: Soft. She exhibits no distension. There is no tenderness.  Musculoskeletal:       Right hip: Normal.       Left  hip: Normal.       Right knee: She exhibits decreased range of motion, swelling and deformity. Tenderness found.       Right ankle: Normal.       Cervical back: Normal.       Thoracic back: Normal.       Lumbar back: Normal.       Right upper leg: She exhibits tenderness, bony tenderness, deformity and laceration (Distal laceration with protruding bone).       Right lower leg: Normal.       Right foot: Normal.  Neurological: She is alert and oriented to person, place, and time. No sensory deficit. GCS eye subscore is 4. GCS verbal subscore is 5. GCS motor subscore is 6.  Skin: Skin is warm and  dry. Laceration (right anterior distal femur with bone protruding) noted.    ED Course  Procedures (including critical care time) Labs Review Labs Reviewed  COMPREHENSIVE METABOLIC PANEL - Abnormal; Notable for the following:    Glucose, Bld 134 (*)    Albumin 3.3 (*)    Total Bilirubin 0.2 (*)    GFR calc non Af Amer 72 (*)    GFR calc Af Amer 83 (*)    All other components within normal limits  CBC - Abnormal; Notable for the following:    Hemoglobin 11.4 (*)    All other components within normal limits  PROTIME-INR  SAMPLE TO BLOOD BANK    Imaging Review Dg Cervical Spine Complete  01/14/2014   CLINICAL DATA:  Recent fall.  EXAM: CERVICAL SPINE  4+ VIEWS  COMPARISON:  Cervical spine CT 01/14/2014  FINDINGS: Alignment of the cervical spine is normal. Degenerative endplate changes in the lower cervical spine. The oblique views are limited. Prevertebral soft tissues are within normal limits.  IMPRESSION: No acute bone abnormality in the cervical spine.   Electronically Signed   By: Richarda Overlie M.D.   On: 01/14/2014 20:17   Dg Hip Complete Right  01/14/2014   ADDENDUM REPORT: 01/14/2014 20:50  ADDENDUM: These results were called by telephone at the time of interpretation on 01/14/2014 at 8:49 PM to Dr. Glynn Octave , who verbally acknowledged these results.   Electronically Signed   By: Richarda Overlie M.D.   On: 01/14/2014 20:50   01/14/2014   CLINICAL DATA:  Fall and right leg injury.  EXAM: RIGHT HIP - COMPLETE 2+ VIEW  COMPARISON:  Right femur 01/14/2014  FINDINGS: Pelvic bony ring is intact. There is marked sclerosis at the pubic symphysis. The right hip is located without acute fracture. There is concern for a complex lesion involving the proximal left femur at the femoral neck and trochanteric region. This lesion could measure up to 6.1 cm with areas sclerosis and lucency.  IMPRESSION: No acute bone abnormality to the right hip.  Atypical lesion at the left femoral neck and trochanteric  region as described. This could represent a fibro-osseous lesion but the lesion could be better characterized with additional views and a dedicated left hip examination.  Electronically Signed: By: Richarda Overlie M.D. On: 01/14/2014 20:37   Dg Femur Right  01/14/2014   CLINICAL DATA:  Fall and injury to the right leg.  EXAM: RIGHT FEMUR - 2 VIEW  COMPARISON:  Right knee 02/04/2012, right hip and right femur dated 01/14/2014  FINDINGS: There is a displaced fracture of the distal femur just above the knee replacement. Multiple small bone fragments. No gross abnormality to the proximal right femur.  IMPRESSION: Displaced fracture of the distal right femur.   Electronically Signed   By: Richarda Overlie M.D.   On: 01/14/2014 20:20   Dg Tibia/fibula Right  01/14/2014   CLINICAL DATA:  Fall and right leg pain.  EXAM: RIGHT TIBIA AND FIBULA - 2 VIEW  COMPARISON:  Right knee 01/14/2014  FINDINGS: There is a total knee replacement. Severely displaced fracture of the distal femur with posterior displacement. Patient has a total knee replacement which is located. The tibia and fibula are intact. Prominent calcaneal spur. Ankle is located.  IMPRESSION: Displaced fracture of the distal femur.  The right tibia and fibula are intact.   Electronically Signed   By: Richarda Overlie M.D.   On: 01/14/2014 20:29   Ct Head Wo Contrast  01/14/2014   CLINICAL DATA:  Status post trauma  EXAM: CT HEAD WITHOUT CONTRAST  CT CERVICAL SPINE WITHOUT CONTRAST  TECHNIQUE: Multidetector CT imaging of the head and cervical spine was performed following the standard protocol without intravenous contrast. Multiplanar CT image reconstructions of the cervical spine were also generated.  COMPARISON:  CT C SPINE W/O CM dated 01/14/2014 and cervical spine W/O CM dated 03/20/2007  FINDINGS: CT HEAD FINDINGS  There is mild age appropriate diffuse cerebral and cerebellar atrophy. There is no shift of the midline. The ventricles are normal in size and position. There  is stable punctate basal ganglia calcification on the right. There is no evidence of an acute intracranial hemorrhage nor of an evolving ischemic event. The cerebellum and brainstem are within the limits of normal.  At bone window settings there are no air-fluid levels in the paranasal sinuses. There is mild mucoperiosteal thickening of the ethmoid and right frontal sinuses. The mastoid air cells are well pneumatized. There is no evidence of an acute skull fracture.  CT CERVICAL SPINE FINDINGS  There is mild stable loss of the normal cervical lordosis. The cervical vertebral bodies are preserved in height. There is mild disc space narrowing at C4-5 and C5-6 and C6-7 which has not significantly changed since the previous study. There is broadly based bulging disc material posteriorly at C4-5 which is been previously described. There is no evidence of a perched facet nor of a facet or spinous process fracture. The prevertebral soft tissue spaces appear normal. The odontoid is intact and the lateral masses of C1 align normally with those of C2. The observed portions of the first and second ribs appear normal. The bony ring at the cervical level is intact. The pulmonary apices appear clear. The visualized soft tissues of the neck exhibit no acute abnormalities.  IMPRESSION: 1. There is no evidence of an acute intracranial hemorrhage nor other acute intracranial abnormality. 2. There is no evidence of an acute skull fracture. 3. There is no evidence of an acute cervical spine fracture nor dislocation. There is moderate stable degenerative disc change of the midcervical spine. Bulging disc material posteriorly at C4-5 is again demonstrated.   Electronically Signed   By: David  [place]   On: 01/14/2014 18:40   Ct Cervical Spine Wo Contrast  01/14/2014   CLINICAL DATA:  Status post trauma  EXAM: CT HEAD WITHOUT CONTRAST  CT CERVICAL SPINE WITHOUT CONTRAST  TECHNIQUE: Multidetector CT imaging of the head and cervical spine  was performed following the standard protocol without intravenous contrast. Multiplanar CT image reconstructions of the cervical spine were also generated.  COMPARISON:  CT C SPINE W/O CM dated 01/14/2014 and cervical spine W/O CM dated 03/20/2007  FINDINGS: CT HEAD FINDINGS  There is mild age appropriate diffuse cerebral and cerebellar atrophy. There is no shift of the midline. The ventricles are normal in size and position. There is stable punctate basal ganglia calcification on the right. There is no evidence of an acute intracranial hemorrhage nor of an evolving ischemic event. The cerebellum and brainstem are within the limits of normal.  At bone window settings there are no air-fluid levels in the paranasal sinuses. There is mild mucoperiosteal thickening of the ethmoid and right frontal sinuses. The mastoid air cells are well pneumatized. There is no evidence of an acute skull fracture.  CT CERVICAL SPINE FINDINGS  There is mild stable loss of the normal cervical lordosis. The cervical vertebral bodies are preserved in height. There is mild disc space narrowing at C4-5 and C5-6 and C6-7 which has not significantly changed since the previous study. There is broadly based bulging disc material posteriorly at C4-5 which is been previously described. There is no evidence of a perched facet nor of a facet or spinous process fracture. The prevertebral soft tissue spaces appear normal. The odontoid is intact and the lateral masses of C1 align normally with those of C2. The observed portions of the first and second ribs appear normal. The bony ring at the cervical level is intact. The pulmonary apices appear clear. The visualized soft tissues of the neck exhibit no acute abnormalities.  IMPRESSION: 1. There is no evidence of an acute intracranial hemorrhage nor other acute intracranial abnormality. 2. There is no evidence of an acute skull fracture. 3. There is no evidence of an acute cervical spine fracture nor  dislocation. There is moderate stable degenerative disc change of the midcervical spine. Bulging disc material posteriorly at C4-5 is again demonstrated.   Electronically Signed   By: David  [place]   On: 01/14/2014 18:40   Dg Pelvis Portable  01/14/2014   CLINICAL DATA:  Fall and complains of right leg pain.  EXAM: PORTABLE PELVIS 1-2 VIEWS  COMPARISON:  None.  FINDINGS: Single view of the pelvis was obtained. There is concern for lucency involving the junction of the right femoral head and neck. This area is difficult to evaluate due to overlying soft tissue structures. There is sclerosis and degenerative disease at the pubic symphysis. There is also sclerosis at the left femoral head and neck junction. Pelvic bony ring appears to be intact.  IMPRESSION: Indeterminate lucency involving the right femoral head and neck region. Recommend dedicated right hip images for further characterization.   Electronically Signed   By: Richarda OverlieAdam  Henn M.D.   On: 01/14/2014 17:14   Dg Chest Portable 1 View  01/14/2014   CLINICAL DATA:  Injury  EXAM: PORTABLE CHEST - 1 VIEW  COMPARISON:  DG CHEST 2V dated 05/08/2007  FINDINGS: Normal heart size. Clear lungs. No pneumothorax. No pleural effusion.  IMPRESSION: No active disease.   Electronically Signed   By: Maryclare BeanArt  Hoss M.D.   On: 01/14/2014 17:10   Dg Knee Complete 4 Views Right  01/14/2014   CLINICAL DATA:  Fall and right leg pain.  EXAM: RIGHT KNEE - COMPLETE 4+ VIEW  COMPARISON:  Right femur 01/14/2014  FINDINGS: There is a displaced fracture of the distal femur just above the total knee replacement. There is marked posterior and lateral displacement of this fracture. There are small bone fragments near the distal aspect of the femur. It is unclear if the patella is located.  IMPRESSION: Comminuted and displaced fracture of the  distal femur.   Electronically Signed   By: Richarda OverlieAdam  Henn M.D.   On: 01/14/2014 20:27     EKG Interpretation None      MDM   Final diagnoses:   Open femur fracture, right    73 year old female with history of hypertension, arthritis, right knee replacement who presented as a level II trauma code for a right open femur fracture [place].  Secondary survey remarkable for distal open right femur fracture with protruding bone.  Neurovascularly intact distal to the injury.  No other injuries identified on secondary survey.  Patient given Dilaudid for pain.  Tetanus was updated.  She was given Ancef 2 g IV.  Wound washed and covered with moist sterile dressing.  CT of the head and C-spine were negative for any acute injuries.  Chest x-ray pelvis x-ray also negative.  She had a displaced distal femur fracture just above her knee replacement.  C-spine cleared clinically in the emergency department.  Orthopedic surgery was consulted and took the patient directly to the operating room for further management.    Cherre RobinsBryan Caryl Manas, MD 01/15/14 [phone]

## 2014-01-14 NOTE — ED Notes (Signed)
Patient returned from xray.

## 2014-01-14 NOTE — ED Notes (Addendum)
Patient transported to OR at this time.  Dr Charlann Boxerlin to see patient in OR.

## 2014-01-14 NOTE — ED Notes (Signed)
Patient placed on 2L [place] of oxygen.  Patient becoming sleepy and oxygen saturation dropping into high 80's while asleep.

## 2014-01-14 NOTE — Anesthesia Preprocedure Evaluation (Addendum)
Anesthesia Evaluation  Patient identified by MRN, date of birth, ID band Patient awake    Reviewed: Allergy & Precautions, H&P , NPO status , Patient's Chart, lab work & pertinent test results  History of Anesthesia Complications (+) PROLONGED EMERGENCE  Airway Mallampati: II TM Distance: >3 FB Neck ROM: Full    Dental no notable dental hx. (+) Partial Upper, Chipped, Dental Advisory Given,    Pulmonary neg pulmonary ROS,  breath sounds clear to auscultation  Pulmonary exam normal       Cardiovascular hypertension, On Medications Rhythm:Regular Rate:Normal     Neuro/Psych negative neurological ROS  negative psych ROS   GI/Hepatic negative GI ROS, Neg liver ROS,   Endo/Other  negative endocrine ROS  Renal/GU negative Renal ROS  negative genitourinary   Musculoskeletal   Abdominal   Peds  Hematology negative hematology ROS (+)   Anesthesia Other Findings   Reproductive/Obstetrics negative OB ROS                         Anesthesia Physical Anesthesia Plan  ASA: II and emergent  Anesthesia Plan: General   Post-op Pain Management:    Induction: Intravenous, Rapid sequence and Cricoid pressure planned  Airway Management Planned: Oral ETT  Additional Equipment:   Intra-op Plan:   Post-operative Plan: Extubation in OR  Informed Consent: I have reviewed the patients History and Physical, chart, labs and discussed the procedure including the risks, benefits and alternatives for the proposed anesthesia with the patient or authorized representative who has indicated his/her understanding and acceptance.   Dental advisory given  Plan Discussed with: CRNA  Anesthesia Plan Comments:         Anesthesia Quick Evaluation

## 2014-01-14 NOTE — ED Notes (Signed)
Pt remains in radiology 

## 2014-01-14 NOTE — ED Notes (Signed)
Patient states leg does feel better after being splinted.

## 2014-01-15 ENCOUNTER — Encounter (HOSPITAL_COMMUNITY): Payer: Self-pay | Admitting: General Practice

## 2014-01-15 ENCOUNTER — Emergency Department (HOSPITAL_COMMUNITY): Payer: No Typology Code available for payment source | Admitting: Anesthesiology

## 2014-01-15 ENCOUNTER — Inpatient Hospital Stay (HOSPITAL_COMMUNITY): Payer: No Typology Code available for payment source

## 2014-01-15 ENCOUNTER — Emergency Department (HOSPITAL_COMMUNITY): Payer: No Typology Code available for payment source

## 2014-01-15 ENCOUNTER — Encounter (HOSPITAL_COMMUNITY): Payer: No Typology Code available for payment source | Admitting: Anesthesiology

## 2014-01-15 DIAGNOSIS — S7290XB Unspecified fracture of unspecified femur, initial encounter for open fracture type I or II: Secondary | ICD-10-CM | POA: Diagnosis not present

## 2014-01-15 DIAGNOSIS — T84049A Periprosthetic fracture around unspecified internal prosthetic joint, initial encounter: Secondary | ICD-10-CM | POA: Diagnosis not present

## 2014-01-15 DIAGNOSIS — S72401B Unspecified fracture of lower end of right femur, initial encounter for open fracture type I or II: Secondary | ICD-10-CM | POA: Diagnosis present

## 2014-01-15 LAB — URINALYSIS, ROUTINE W REFLEX MICROSCOPIC
Bilirubin Urine: NEGATIVE
Glucose, UA: NEGATIVE mg/dL
HGB URINE DIPSTICK: NEGATIVE
Ketones, ur: NEGATIVE mg/dL
Leukocytes, UA: NEGATIVE
NITRITE: NEGATIVE
Protein, ur: NEGATIVE mg/dL
SPECIFIC GRAVITY, URINE: 1.023 (ref 1.005–1.030)
UROBILINOGEN UA: 0.2 mg/dL (ref 0.0–1.0)
pH: 5.5 (ref 5.0–8.0)

## 2014-01-15 MED ORDER — STERILE WATER FOR INJECTION IJ SOLN
INTRAMUSCULAR | Status: AC
  Filled 2014-01-15: qty 10

## 2014-01-15 MED ORDER — FERROUS SULFATE 325 (65 FE) MG PO TABS
325.0000 mg | ORAL_TABLET | Freq: Three times a day (TID) | ORAL | Status: DC
  Administered 2014-01-15 – 2014-01-23 (×22): 325 mg via ORAL
  Filled 2014-01-15 (×28): qty 1

## 2014-01-15 MED ORDER — CEFAZOLIN SODIUM-DEXTROSE 2-3 GM-% IV SOLR
2.0000 g | Freq: Once | INTRAVENOUS | Status: AC
  Administered 2014-01-15: 2 g via INTRAVENOUS
  Filled 2014-01-15: qty 50

## 2014-01-15 MED ORDER — VECURONIUM BROMIDE 10 MG IV SOLR
INTRAVENOUS | Status: AC
  Filled 2014-01-15: qty 10

## 2014-01-15 MED ORDER — OXYCODONE HCL 5 MG PO TABS
5.0000 mg | ORAL_TABLET | ORAL | Status: DC | PRN
  Administered 2014-01-15: 15 mg via ORAL
  Administered 2014-01-15 – 2014-01-16 (×2): 10 mg via ORAL
  Administered 2014-01-16 – 2014-01-19 (×8): 15 mg via ORAL
  Administered 2014-01-19 (×2): 5 mg via ORAL
  Administered 2014-01-20 – 2014-01-23 (×12): 10 mg via ORAL
  Filled 2014-01-15: qty 3
  Filled 2014-01-15: qty 1
  Filled 2014-01-15: qty 3
  Filled 2014-01-15 (×6): qty 2
  Filled 2014-01-15 (×4): qty 3
  Filled 2014-01-15: qty 2
  Filled 2014-01-15: qty 3
  Filled 2014-01-15 (×2): qty 2
  Filled 2014-01-15 (×3): qty 3
  Filled 2014-01-15 (×2): qty 2
  Filled 2014-01-15: qty 1
  Filled 2014-01-15 (×2): qty 2
  Filled 2014-01-15: qty 3
  Filled 2014-01-15: qty 2

## 2014-01-15 MED ORDER — METOCLOPRAMIDE HCL 5 MG/ML IJ SOLN: 5.0000 mg | Freq: Three times a day (TID) | INTRAMUSCULAR | Status: DC | PRN

## 2014-01-15 MED ORDER — ENOXAPARIN SODIUM 40 MG/0.4ML ~~LOC~~ SOLN
40.0000 mg | SUBCUTANEOUS | Status: DC
  Administered 2014-01-16 – 2014-01-23 (×8): 40 mg via SUBCUTANEOUS
  Filled 2014-01-15 (×9): qty 0.4

## 2014-01-15 MED ORDER — METHOCARBAMOL 500 MG PO TABS
500.0000 mg | ORAL_TABLET | Freq: Four times a day (QID) | ORAL | Status: DC | PRN
  Administered 2014-01-15 – 2014-01-23 (×11): 500 mg via ORAL
  Filled 2014-01-15 (×13): qty 1

## 2014-01-15 MED ORDER — ARTIFICIAL TEARS OP OINT
TOPICAL_OINTMENT | OPHTHALMIC | Status: DC | PRN
  Administered 2014-01-15: 1 via OPHTHALMIC

## 2014-01-15 MED ORDER — NEOSTIGMINE METHYLSULFATE 1 MG/ML IJ SOLN
INTRAMUSCULAR | Status: AC
  Filled 2014-01-15: qty 10

## 2014-01-15 MED ORDER — GLYCOPYRROLATE 0.2 MG/ML IJ SOLN
INTRAMUSCULAR | Status: DC | PRN
  Administered 2014-01-15: 0.6 mg via INTRAVENOUS

## 2014-01-15 MED ORDER — ONDANSETRON HCL 4 MG/2ML IJ SOLN: 4.0000 mg | Freq: Four times a day (QID) | INTRAMUSCULAR | Status: DC | PRN

## 2014-01-15 MED ORDER — ALBUTEROL SULFATE HFA 108 (90 BASE) MCG/ACT IN AERS
INHALATION_SPRAY | RESPIRATORY_TRACT | Status: DC | PRN
  Administered 2014-01-15: 2 via RESPIRATORY_TRACT

## 2014-01-15 MED ORDER — ONDANSETRON HCL 4 MG PO TABS: 4.0000 mg | ORAL_TABLET | Freq: Four times a day (QID) | ORAL | Status: DC | PRN

## 2014-01-15 MED ORDER — ALUM & MAG HYDROXIDE-SIMETH 200-200-20 MG/5ML PO SUSP
30.0000 mL | ORAL | Status: DC | PRN
  Administered 2014-01-18: 30 mL via ORAL
  Filled 2014-01-15: qty 30

## 2014-01-15 MED ORDER — ACETAMINOPHEN 500 MG PO TABS
1000.0000 mg | ORAL_TABLET | Freq: Three times a day (TID) | ORAL | Status: DC | PRN
  Administered 2014-01-15: 1000 mg via ORAL
  Filled 2014-01-15: qty 2

## 2014-01-15 MED ORDER — HYDROCODONE-ACETAMINOPHEN 5-325 MG PO TABS
1.0000 | ORAL_TABLET | Freq: Four times a day (QID) | ORAL | Status: DC | PRN
  Administered 2014-01-15 – 2014-01-21 (×8): 2 via ORAL
  Filled 2014-01-15 (×8): qty 2

## 2014-01-15 MED ORDER — PHENOL 1.4 % MT LIQD
1.0000 | OROMUCOSAL | Status: DC | PRN
  Filled 2014-01-15: qty 177

## 2014-01-15 MED ORDER — SODIUM CHLORIDE 0.9 % IR SOLN
Status: DC | PRN
  Administered 2014-01-15: 3000 mL

## 2014-01-15 MED ORDER — HYDROMORPHONE HCL PF 1 MG/ML IJ SOLN
0.5000 mg | INTRAMUSCULAR | Status: DC | PRN
  Administered 2014-01-15 (×2): 2 mg via INTRAVENOUS
  Administered 2014-01-15 (×2): 1 mg via INTRAVENOUS
  Administered 2014-01-15 – 2014-01-16 (×3): 2 mg via INTRAVENOUS
  Administered 2014-01-17: 1 mg via INTRAVENOUS
  Filled 2014-01-15: qty 2
  Filled 2014-01-15: qty 1
  Filled 2014-01-15 (×3): qty 2
  Filled 2014-01-15: qty 1
  Filled 2014-01-15: qty 2
  Filled 2014-01-15: qty 1
  Filled 2014-01-15: qty 2

## 2014-01-15 MED ORDER — FENTANYL CITRATE 0.05 MG/ML IJ SOLN
INTRAMUSCULAR | Status: AC
  Filled 2014-01-15: qty 5

## 2014-01-15 MED ORDER — HYDROMORPHONE HCL PF 1 MG/ML IJ SOLN
0.5000 mg | INTRAMUSCULAR | Status: DC | PRN
  Administered 2014-01-15 (×2): 0.5 mg via INTRAVENOUS

## 2014-01-15 MED ORDER — MENTHOL 3 MG MT LOZG: 1.0000 | LOZENGE | OROMUCOSAL | Status: DC | PRN

## 2014-01-15 MED ORDER — VECURONIUM BROMIDE 10 MG IV SOLR
INTRAVENOUS | Status: DC | PRN
  Administered 2014-01-15: 3 mg via INTRAVENOUS
  Administered 2014-01-15: 2 mg via INTRAVENOUS

## 2014-01-15 MED ORDER — FENTANYL CITRATE 0.05 MG/ML IJ SOLN
INTRAMUSCULAR | Status: AC
  Filled 2014-01-15: qty 2

## 2014-01-15 MED ORDER — TRIAMTERENE-HCTZ 37.5-25 MG PO TABS
1.0000 | ORAL_TABLET | Freq: Every day | ORAL | Status: DC
  Administered 2014-01-15 – 2014-01-23 (×9): 1 via ORAL
  Filled 2014-01-15 (×9): qty 1

## 2014-01-15 MED ORDER — BUDESONIDE-FORMOTEROL FUMARATE 160-4.5 MCG/ACT IN AERO
2.0000 | INHALATION_SPRAY | Freq: Two times a day (BID) | RESPIRATORY_TRACT | Status: DC
  Administered 2014-01-15 – 2014-01-23 (×16): 2 via RESPIRATORY_TRACT
  Filled 2014-01-15: qty 6

## 2014-01-15 MED ORDER — METOCLOPRAMIDE HCL 10 MG PO TABS: 5.0000 mg | ORAL_TABLET | Freq: Three times a day (TID) | ORAL | Status: DC | PRN

## 2014-01-15 MED ORDER — ESTRADIOL 2 MG PO TABS
2.0000 mg | ORAL_TABLET | ORAL | Status: DC
  Administered 2014-01-16 – 2014-01-22 (×4): 2 mg via ORAL
  Filled 2014-01-15 (×4): qty 1

## 2014-01-15 MED ORDER — ONDANSETRON HCL 4 MG/2ML IJ SOLN
INTRAMUSCULAR | Status: DC | PRN
  Administered 2014-01-15: 4 mg via INTRAVENOUS

## 2014-01-15 MED ORDER — PROPOFOL 10 MG/ML IV BOLUS
INTRAVENOUS | Status: DC | PRN
Start: 2014-01-15 — End: 2014-01-15
  Administered 2014-01-15: 150 mg via INTRAVENOUS

## 2014-01-15 MED ORDER — LIDOCAINE HCL (CARDIAC) 20 MG/ML IV SOLN
INTRAVENOUS | Status: DC | PRN
  Administered 2014-01-15: 60 mg via INTRAVENOUS

## 2014-01-15 MED ORDER — DIPHENHYDRAMINE HCL 12.5 MG/5ML PO ELIX: 12.5000 mg | ORAL_SOLUTION | Freq: Four times a day (QID) | ORAL | Status: DC | PRN

## 2014-01-15 MED ORDER — POTASSIUM CHLORIDE 2 MEQ/ML IV SOLN
INTRAVENOUS | Status: DC
  Administered 2014-01-15 – 2014-01-20 (×4): via INTRAVENOUS
  Filled 2014-01-15 (×18): qty 1000

## 2014-01-15 MED ORDER — ACETAMINOPHEN 650 MG RE SUPP: 650.0000 mg | Freq: Four times a day (QID) | RECTAL | Status: DC | PRN

## 2014-01-15 MED ORDER — SUCCINYLCHOLINE CHLORIDE 20 MG/ML IJ SOLN
INTRAMUSCULAR | Status: DC | PRN
  Administered 2014-01-15: 100 mg via INTRAVENOUS

## 2014-01-15 MED ORDER — PHENYLEPHRINE HCL 10 MG/ML IJ SOLN
10.0000 mg | INTRAVENOUS | Status: DC | PRN
  Administered 2014-01-15: 25 ug/min via INTRAVENOUS

## 2014-01-15 MED ORDER — DOCUSATE SODIUM 100 MG PO CAPS
100.0000 mg | ORAL_CAPSULE | Freq: Two times a day (BID) | ORAL | Status: DC
  Administered 2014-01-15 – 2014-01-23 (×17): 100 mg via ORAL
  Filled 2014-01-15 (×18): qty 1

## 2014-01-15 MED ORDER — DEXTROSE 5 % IV SOLN
500.0000 mg | Freq: Four times a day (QID) | INTRAVENOUS | Status: DC | PRN
  Administered 2014-01-15 (×2): 500 mg via INTRAVENOUS
  Filled 2014-01-15 (×2): qty 5

## 2014-01-15 MED ORDER — CIPROFLOXACIN IN D5W 400 MG/200ML IV SOLN
400.0000 mg | Freq: Once | INTRAVENOUS | Status: AC
  Administered 2014-01-15: 400 mg via INTRAVENOUS
  Filled 2014-01-15: qty 200

## 2014-01-15 MED ORDER — CEFAZOLIN SODIUM-DEXTROSE 2-3 GM-% IV SOLR
2.0000 g | Freq: Four times a day (QID) | INTRAVENOUS | Status: AC
  Administered 2014-01-15 (×2): 2 g via INTRAVENOUS
  Filled 2014-01-15 (×2): qty 50

## 2014-01-15 MED ORDER — ALBUTEROL SULFATE HFA 108 (90 BASE) MCG/ACT IN AERS
INHALATION_SPRAY | RESPIRATORY_TRACT | Status: AC
  Filled 2014-01-15: qty 6.7

## 2014-01-15 MED ORDER — GLYCOPYRROLATE 0.2 MG/ML IJ SOLN
INTRAMUSCULAR | Status: AC
  Filled 2014-01-15: qty 3

## 2014-01-15 MED ORDER — MAGNESIUM CITRATE PO SOLN: 1.0000 | Freq: Once | ORAL | Status: AC | PRN

## 2014-01-15 MED ORDER — ALBUTEROL SULFATE (2.5 MG/3ML) 0.083% IN NEBU
2.5000 mg | INHALATION_SOLUTION | Freq: Every day | RESPIRATORY_TRACT | Status: DC | PRN
  Administered 2014-01-20: 2.5 mg via RESPIRATORY_TRACT
  Filled 2014-01-15: qty 3

## 2014-01-15 MED ORDER — 0.9 % SODIUM CHLORIDE (POUR BTL) OPTIME
TOPICAL | Status: DC | PRN
  Administered 2014-01-15: 1000 mL

## 2014-01-15 MED ORDER — FENTANYL CITRATE 0.05 MG/ML IJ SOLN
INTRAMUSCULAR | Status: DC | PRN
  Administered 2014-01-14: 25 ug via INTRAVENOUS
  Administered 2014-01-14: 50 ug via INTRAVENOUS
  Administered 2014-01-14: 25 ug via INTRAVENOUS
  Administered 2014-01-15 (×3): 50 ug via INTRAVENOUS
  Administered 2014-01-15: 25 ug via INTRAVENOUS
  Administered 2014-01-15: 50 ug via INTRAVENOUS
  Administered 2014-01-15 (×3): 25 ug via INTRAVENOUS
  Administered 2014-01-15 (×2): 50 ug via INTRAVENOUS

## 2014-01-15 MED ORDER — ACETAMINOPHEN 325 MG PO TABS: 650.0000 mg | ORAL_TABLET | Freq: Four times a day (QID) | ORAL | Status: DC | PRN

## 2014-01-15 MED ORDER — FENTANYL CITRATE 0.05 MG/ML IJ SOLN
25.0000 ug | INTRAMUSCULAR | Status: DC | PRN
  Administered 2014-01-15 (×3): 50 ug via INTRAVENOUS

## 2014-01-15 MED ORDER — POLYETHYLENE GLYCOL 3350 17 G PO PACK
17.0000 g | PACK | Freq: Two times a day (BID) | ORAL | Status: DC
  Administered 2014-01-15 – 2014-01-23 (×14): 17 g via ORAL
  Filled 2014-01-15 (×18): qty 1

## 2014-01-15 MED ORDER — NEOSTIGMINE METHYLSULFATE 1 MG/ML IJ SOLN
INTRAMUSCULAR | Status: DC | PRN
  Administered 2014-01-15: 4 mg via INTRAVENOUS

## 2014-01-15 MED ORDER — PHENYLEPHRINE HCL 10 MG/ML IJ SOLN
INTRAMUSCULAR | Status: DC | PRN
  Administered 2014-01-15: 80 ug via INTRAVENOUS

## 2014-01-15 MED ORDER — HYDROMORPHONE HCL PF 1 MG/ML IJ SOLN
INTRAMUSCULAR | Status: AC
  Filled 2014-01-15: qty 1

## 2014-01-15 NOTE — Consult Note (Signed)
  Patient seen and evaluated for right distal femur periprosthetic fracture To OR this am for ORIF right femur  NPO Consent signed Full note to follow
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
## 2014-01-15 NOTE — Brief Op Note (Signed)
01/14/2014 - 01/15/2014  4:27 AM  PATIENT:  Lindsey Flynn  73 y.o. female  PRE-OPERATIVE DIAGNOSIS:  Grade II open Right Distal periprosthetic Femur Fracture  POST-OPERATIVE DIAGNOSIS:  Grade II open Right Distal periprosthetic Femur Fracture  PROCEDURE:  Procedure(s): INTRAMEDULLARY (IM) RETROGRADE FEMORAL NAILING (Right) 2. Excisional (sharply with scalpel and Bovie) and non-excisional debridement of right distal thigh wound  SURGEON:  Surgeon(s) and Role:    * Shelda PalMatthew D Leocadia Idleman, MD - Primary  ASSISTANTS: surgical   ANESTHESIA:   general  EBL:  Total I/O In: 1730 [I.V.:1730] Out: 850 [Urine:600; Blood:250]  BLOOD ADMINISTERED:none  DRAINS: none   LOCAL MEDICATIONS USED:  NONE  SPECIMEN:  No Specimen  DISPOSITION OF SPECIMEN:  N/A  COUNTS:  YES  TOURNIQUET:    DICTATION: .Other Dictation: Dictation Number [id number]  PLAN OF CARE: Admit to inpatient   PATIENT DISPOSITION:  PACU - hemodynamically stable.   Delay start of Pharmacological VTE agent (>24hrs) due to surgical blood loss or risk of bleeding: no

## 2014-01-15 NOTE — Transfer of Care (Signed)
Immediate Anesthesia Transfer of Care Note  Patient: Lindsey Flynn  Procedure(s) Performed: Procedure(s): INTRAMEDULLARY (IM) RETROGRADE FEMORAL NAILING (Right)  Patient Location: PACU  Anesthesia Type:General  Level of Consciousness: awake  Airway & Oxygen Therapy: Patient Spontanous Breathing and Patient connected to nasal cannula oxygen  Post-op Assessment: Report given to PACU RN, Post -op Vital signs reviewed and stable and Patient moving all extremities  Post vital signs: Reviewed and stable  Complications: No apparent anesthesia complications

## 2014-01-15 NOTE — H&P (Signed)
Lindsey Flynn is an 73 y.o. female.   Chief Complaint: right open femur fracture HPI: The history is provided by the patient and the EMS personnel.  history of present illness: 73 year old female who presents via EMS for a right open femur fracture. A retaining wall fell onto her legs today [place] [facility]. Patient denies head injury or losing consciousness. She currently complains only of pain in the right leg. Pain is constant, and severe. She denies any loss of sensation. Patient has history of prior right knee replacement. EMS reports seeing a piece of bone sticking out of the distal anterior right thigh.Dr Alvan Dame consulted after xrays revealed open fracture distal femur.   Past Medical History  Diagnosis Date  . Hypertension   . Arthritis     Past Surgical History  Procedure Laterality Date  . Joint replacement    . Uterine fibroid surgery      Family History  Problem Relation Age of Onset  . Cancer Sister   . Diabetes Other    Social History:  reports that she has never smoked. She has never used smokeless tobacco. She reports that she does not drink alcohol or use illicit drugs.  Allergies: No Known Allergies  Medications Prior to Admission  Medication Sig Dispense Refill  . estradiol (ESTRACE) 2 MG tablet Take 2 mg by mouth every other day.       . ibuprofen (ADVIL,MOTRIN) 100 MG tablet Take 200 mg by mouth every 6 (six) hours as needed for pain or fever.      Marland Kitchen PROAIR HFA 108 (90 BASE) MCG/ACT inhaler Inhale 2 puffs into the lungs daily as needed.      . SYMBICORT 160-4.5 MCG/ACT inhaler Inhale 2 puffs into the lungs 2 (two) times daily.      Marland Kitchen triamterene-hydrochlorothiazide (MAXZIDE-25) 37.5-25 MG per tablet Take 1 tablet by mouth daily.        Results for orders placed during the hospital encounter of 01/14/14 (from the past 48 hour(s))  COMPREHENSIVE METABOLIC PANEL     Status: Abnormal   Collection Time    01/14/14  5:27 PM      Result Value Ref  Range   Sodium 140  137 - 147 mEq/L   Potassium 4.3  3.7 - 5.3 mEq/L   Chloride 101  96 - 112 mEq/L   CO2 27  19 - 32 mEq/L   Glucose, Bld 134 (*) 70 - 99 mg/dL   BUN 18  6 - 23 mg/dL   Creatinine, Ser 0.81  0.50 - 1.10 mg/dL   Calcium 9.1  8.4 - 10.5 mg/dL   Total Protein 7.6  6.0 - 8.3 g/dL   Albumin 3.3 (*) 3.5 - 5.2 g/dL   AST 16  0 - 37 U/L   ALT 9  0 - 35 U/L   Alkaline Phosphatase 91  39 - 117 U/L   Total Bilirubin 0.2 (*) 0.3 - 1.2 mg/dL   GFR calc non Af Amer 72 (*) >90 mL/min   GFR calc Af Amer 83 (*) >90 mL/min   Comment: (NOTE)     The eGFR has been calculated using the CKD EPI equation.     This calculation has not been validated in all clinical situations.     eGFR's persistently <90 mL/min signify possible Chronic Kidney     Disease.  CBC     Status: Abnormal   Collection Time    01/14/14  5:27 PM  Result Value Ref Range   WBC 9.7  4.0 - 10.5 K/uL   RBC 4.20  3.87 - 5.11 MIL/uL   Hemoglobin 11.4 (*) 12.0 - 15.0 g/dL   HCT 36.8  36.0 - 46.0 %   MCV 87.6  78.0 - 100.0 fL   MCH 27.1  26.0 - 34.0 pg   MCHC 31.0  30.0 - 36.0 g/dL   RDW 15.3  11.5 - 15.5 %   Platelets 246  150 - 400 K/uL  PROTIME-INR     Status: None   Collection Time    01/14/14  5:27 PM      Result Value Ref Range   Prothrombin Time 12.4  11.6 - 15.2 seconds   INR 0.94  0.00 - 1.49  SAMPLE TO BLOOD BANK     Status: None   Collection Time    01/14/14  5:35 PM      Result Value Ref Range   Blood Bank Specimen SAMPLE AVAILABLE FOR TESTING     Sample Expiration 01/15/2014    URINALYSIS, ROUTINE W REFLEX MICROSCOPIC     Status: None   Collection Time    01/15/14  1:49 AM      Result Value Ref Range   Color, Urine YELLOW  YELLOW   APPearance CLEAR  CLEAR   Specific Gravity, Urine 1.023  1.005 - 1.030   pH 5.5  5.0 - 8.0   Glucose, UA NEGATIVE  NEGATIVE mg/dL   Hgb urine dipstick NEGATIVE  NEGATIVE   Bilirubin Urine NEGATIVE  NEGATIVE   Ketones, ur NEGATIVE  NEGATIVE mg/dL   Protein,  ur NEGATIVE  NEGATIVE mg/dL   Urobilinogen, UA 0.2  0.0 - 1.0 mg/dL   Nitrite NEGATIVE  NEGATIVE   Leukocytes, UA NEGATIVE  NEGATIVE   Comment: MICROSCOPIC NOT DONE ON URINES WITH NEGATIVE PROTEIN, BLOOD, LEUKOCYTES, NITRITE, OR GLUCOSE <1000 mg/dL.   Dg Cervical Spine Complete  01/14/2014   CLINICAL DATA:  Recent fall.  EXAM: CERVICAL SPINE  4+ VIEWS  COMPARISON:  Cervical spine CT 01/14/2014  FINDINGS: Alignment of the cervical spine is normal. Degenerative endplate changes in the lower cervical spine. The oblique views are limited. Prevertebral soft tissues are within normal limits.  IMPRESSION: No acute bone abnormality in the cervical spine.   Electronically Signed   By: Markus Daft M.D.   On: 01/14/2014 20:17   Dg Hip Complete Right  01/14/2014   ADDENDUM REPORT: 01/14/2014 20:50  ADDENDUM: These results were called by telephone at the time of interpretation on 01/14/2014 at 8:49 PM to Dr. Ezequiel Essex , who verbally acknowledged these results.   Electronically Signed   By: Markus Daft M.D.   On: 01/14/2014 20:50   01/14/2014   CLINICAL DATA:  Fall and right leg injury.  EXAM: RIGHT HIP - COMPLETE 2+ VIEW  COMPARISON:  Right femur 01/14/2014  FINDINGS: Pelvic bony ring is intact. There is marked sclerosis at the pubic symphysis. The right hip is located without acute fracture. There is concern for a complex lesion involving the proximal left femur at the femoral neck and trochanteric region. This lesion could measure up to 6.1 cm with areas sclerosis and lucency.  IMPRESSION: No acute bone abnormality to the right hip.  Atypical lesion at the left femoral neck and trochanteric region as described. This could represent a fibro-osseous lesion but the lesion could be better characterized with additional views and a dedicated left hip examination.  Electronically Signed: By: Markus Daft  M.D. On: 01/14/2014 20:37   Dg Femur Right  01/15/2014   CLINICAL DATA:  Internal fixation of right femoral  fracture.  EXAM: RIGHT FEMUR - 2 VIEW  COMPARISON:  Right femur radiographs performed 01/14/2014  FINDINGS: Three fluoroscopic C-arm images are provided from the OR. An intramedullary rod is seen transfixing the comminuted fracture of the distal femur in near anatomic alignment, though multiple smaller displaced fragments are still seen. The visualized portions of the patient's total knee arthroplasty are grossly unremarkable. A large amount of soft tissue air is seen about the distal femur.  IMPRESSION: Status post internal fixation of distal femoral comminuted fracture in near anatomic alignment.   Electronically Signed   By: Garald Balding M.D.   On: 01/15/2014 06:08   Dg Femur Right  01/14/2014   CLINICAL DATA:  Fall and injury to the right leg.  EXAM: RIGHT FEMUR - 2 VIEW  COMPARISON:  Right knee 02/04/2012, right hip and right femur dated 01/14/2014  FINDINGS: There is a displaced fracture of the distal femur just above the knee replacement. Multiple small bone fragments. No gross abnormality to the proximal right femur.  IMPRESSION: Displaced fracture of the distal right femur.   Electronically Signed   By: Markus Daft M.D.   On: 01/14/2014 20:20   Dg Tibia/fibula Right  01/14/2014   CLINICAL DATA:  Fall and right leg pain.  EXAM: RIGHT TIBIA AND FIBULA - 2 VIEW  COMPARISON:  Right knee 01/14/2014  FINDINGS: There is a total knee replacement. Severely displaced fracture of the distal femur with posterior displacement. Patient has a total knee replacement which is located. The tibia and fibula are intact. Prominent calcaneal spur. Ankle is located.  IMPRESSION: Displaced fracture of the distal femur.  The right tibia and fibula are intact.   Electronically Signed   By: Markus Daft M.D.   On: 01/14/2014 20:29   Ct Head Wo Contrast  01/14/2014   CLINICAL DATA:  Status post trauma  EXAM: CT HEAD WITHOUT CONTRAST  CT CERVICAL SPINE WITHOUT CONTRAST  TECHNIQUE: Multidetector CT imaging of the head and  cervical spine was performed following the standard protocol without intravenous contrast. Multiplanar CT image reconstructions of the cervical spine were also generated.  COMPARISON:  CT C SPINE W/O CM dated 01/14/2014 and cervical spine W/O CM dated 03/20/2007  FINDINGS: CT HEAD FINDINGS  There is mild age appropriate diffuse cerebral and cerebellar atrophy. There is no shift of the midline. The ventricles are normal in size and position. There is stable punctate basal ganglia calcification on the right. There is no evidence of an acute intracranial hemorrhage nor of an evolving ischemic event. The cerebellum and brainstem are within the limits of normal.  At bone window settings there are no air-fluid levels in the paranasal sinuses. There is mild mucoperiosteal thickening of the ethmoid and right frontal sinuses. The mastoid air cells are well pneumatized. There is no evidence of an acute skull fracture.  CT CERVICAL SPINE FINDINGS  There is mild stable loss of the normal cervical lordosis. The cervical vertebral bodies are preserved in height. There is mild disc space narrowing at C4-5 and C5-6 and C6-7 which has not significantly changed since the previous study. There is broadly based bulging disc material posteriorly at C4-5 which is been previously described. There is no evidence of a perched facet nor of a facet or spinous process fracture. The prevertebral soft tissue spaces appear normal. The odontoid is intact and the lateral  masses of C1 align normally with those of C2. The observed portions of the first and second ribs appear normal. The bony ring at the cervical level is intact. The pulmonary apices appear clear. The visualized soft tissues of the neck exhibit no acute abnormalities.  IMPRESSION: 1. There is no evidence of an acute intracranial hemorrhage nor other acute intracranial abnormality. 2. There is no evidence of an acute skull fracture. 3. There is no evidence of an acute cervical spine  fracture nor dislocation. There is moderate stable degenerative disc change of the midcervical spine. Bulging disc material posteriorly at C4-5 is again demonstrated.   Electronically Signed   By: David  Martinique   On: 01/14/2014 18:40   Ct Cervical Spine Wo Contrast  01/14/2014   CLINICAL DATA:  Status post trauma  EXAM: CT HEAD WITHOUT CONTRAST  CT CERVICAL SPINE WITHOUT CONTRAST  TECHNIQUE: Multidetector CT imaging of the head and cervical spine was performed following the standard protocol without intravenous contrast. Multiplanar CT image reconstructions of the cervical spine were also generated.  COMPARISON:  CT C SPINE W/O CM dated 01/14/2014 and cervical spine W/O CM dated 03/20/2007  FINDINGS: CT HEAD FINDINGS  There is mild age appropriate diffuse cerebral and cerebellar atrophy. There is no shift of the midline. The ventricles are normal in size and position. There is stable punctate basal ganglia calcification on the right. There is no evidence of an acute intracranial hemorrhage nor of an evolving ischemic event. The cerebellum and brainstem are within the limits of normal.  At bone window settings there are no air-fluid levels in the paranasal sinuses. There is mild mucoperiosteal thickening of the ethmoid and right frontal sinuses. The mastoid air cells are well pneumatized. There is no evidence of an acute skull fracture.  CT CERVICAL SPINE FINDINGS  There is mild stable loss of the normal cervical lordosis. The cervical vertebral bodies are preserved in height. There is mild disc space narrowing at C4-5 and C5-6 and C6-7 which has not significantly changed since the previous study. There is broadly based bulging disc material posteriorly at C4-5 which is been previously described. There is no evidence of a perched facet nor of a facet or spinous process fracture. The prevertebral soft tissue spaces appear normal. The odontoid is intact and the lateral masses of C1 align normally with those of C2. The  observed portions of the first and second ribs appear normal. The bony ring at the cervical level is intact. The pulmonary apices appear clear. The visualized soft tissues of the neck exhibit no acute abnormalities.  IMPRESSION: 1. There is no evidence of an acute intracranial hemorrhage nor other acute intracranial abnormality. 2. There is no evidence of an acute skull fracture. 3. There is no evidence of an acute cervical spine fracture nor dislocation. There is moderate stable degenerative disc change of the midcervical spine. Bulging disc material posteriorly at C4-5 is again demonstrated.   Electronically Signed   By: David  Martinique   On: 01/14/2014 18:40   Dg Pelvis Portable  01/14/2014   CLINICAL DATA:  Fall and complains of right leg pain.  EXAM: PORTABLE PELVIS 1-2 VIEWS  COMPARISON:  None.  FINDINGS: Single view of the pelvis was obtained. There is concern for lucency involving the junction of the right femoral head and neck. This area is difficult to evaluate due to overlying soft tissue structures. There is sclerosis and degenerative disease at the pubic symphysis. There is also sclerosis at the left femoral  head and neck junction. Pelvic bony ring appears to be intact.  IMPRESSION: Indeterminate lucency involving the right femoral head and neck region. Recommend dedicated right hip images for further characterization.   Electronically Signed   By: Markus Daft M.D.   On: 01/14/2014 17:14   Dg Chest Portable 1 View  01/14/2014   CLINICAL DATA:  Injury  EXAM: PORTABLE CHEST - 1 VIEW  COMPARISON:  DG CHEST 2V dated 05/08/2007  FINDINGS: Normal heart size. Clear lungs. No pneumothorax. No pleural effusion.  IMPRESSION: No active disease.   Electronically Signed   By: Maryclare Bean M.D.   On: 01/14/2014 17:10   Dg Knee Complete 4 Views Right  01/14/2014   CLINICAL DATA:  Fall and right leg pain.  EXAM: RIGHT KNEE - COMPLETE 4+ VIEW  COMPARISON:  Right femur 01/14/2014  FINDINGS: There is a displaced  fracture of the distal femur just above the total knee replacement. There is marked posterior and lateral displacement of this fracture. There are small bone fragments near the distal aspect of the femur. It is unclear if the patella is located.  IMPRESSION: Comminuted and displaced fracture of the distal femur.   Electronically Signed   By: Markus Daft M.D.   On: 01/14/2014 20:27    Review of Systems  Musculoskeletal: Positive for falls and joint pain.    Blood pressure 143/108, pulse 77, temperature 97.4 F (36.3 C), temperature source Oral, resp. rate 16, height _0  (1.702 m), weight 113.399 kg (250 lb), SpO2 93.00%. Physical Exam  Constitutional: She is oriented to person, place, and time. She appears well-developed and well-nourished.  HENT:  Head: Normocephalic and atraumatic.  Eyes: EOM are normal. Pupils are equal, round, and reactive to light.  Neck: Normal range of motion. Neck supple.  Cardiovascular: Normal rate and regular rhythm.   Respiratory: Effort normal and breath sounds normal.  GI: Soft. Bowel sounds are normal.  Musculoskeletal: She exhibits edema.  And obvious deformity with open wound distal femur.  Neurological: She is alert and oriented to person, place, and time.  Skin: Skin is warm and dry.  Psychiatric: She has a normal mood and affect.     Assessment/Plan To Operating Room for surgery per Dr Alvan Dame; post operative plan and orders per [facility] Benedetto Goad 01/15/2014, 8:11 AM

## 2014-01-15 NOTE — Care Management Note (Signed)
CARE MANAGEMENT NOTE 01/15/2014  Patient:  Lindsey Flynn,Lindsey Flynn   Account Number:  [id number]  Date Initiated:  01/15/2014  Documentation initiated by:  Vance PeperBRADY,Griffey Nicasio  Subjective/Objective Assessment:   73 yr old female s/p right open femur fracture requiring IM Nailing.     Action/Plan:   PT/OT eval. CM will continue to monitor.   Anticipated DC Date:     Anticipated DC Plan:           Choice offered to / List presented to:             Status of service:  In process, will continue to follow

## 2014-01-15 NOTE — Progress Notes (Signed)
Utilization review completed.  

## 2014-01-16 LAB — BASIC METABOLIC PANEL
BUN: 16 mg/dL (ref 6–23)
CO2: 28 mEq/L (ref 19–32)
Calcium: 8 mg/dL — ABNORMAL LOW (ref 8.4–10.5)
Chloride: 97 mEq/L (ref 96–112)
Creatinine, Ser: 1.17 mg/dL — ABNORMAL HIGH (ref 0.50–1.10)
GFR, EST AFRICAN AMERICAN: 53 mL/min — AB (ref >90)
GFR, EST NON AFRICAN AMERICAN: 46 mL/min — AB (ref >90)
Glucose, Bld: 140 mg/dL — ABNORMAL HIGH (ref 70–99)
POTASSIUM: 4.8 meq/L (ref 3.7–5.3)
Sodium: 138 mEq/L (ref 137–147)

## 2014-01-16 LAB — CBC
HEMATOCRIT: 30.1 % — AB (ref 36.0–46.0)
Hemoglobin: 8.9 g/dL — ABNORMAL LOW (ref 12.0–15.0)
MCH: 26.6 pg (ref 26.0–34.0)
MCHC: 29.6 g/dL — AB (ref 30.0–36.0)
MCV: 89.9 fL (ref 78.0–100.0)
PLATELETS: 199 10*3/uL (ref 150–400)
RBC: 3.35 MIL/uL — ABNORMAL LOW (ref 3.87–5.11)
RDW: 15.5 % (ref 11.5–15.5)
WBC: 9 10*3/uL (ref 4.0–10.5)

## 2014-01-16 MED ORDER — DOXYCYCLINE HYCLATE 100 MG PO TABS
100.0000 mg | ORAL_TABLET | Freq: Two times a day (BID) | ORAL | Status: DC
  Administered 2014-01-17 – 2014-01-23 (×13): 100 mg via ORAL
  Filled 2014-01-16 (×15): qty 1

## 2014-01-16 NOTE — Progress Notes (Signed)
Patient ID: Lindsey Flynn, female   DOB: 12-12-1940, 73 y.o.   MRN: [id number] Subjective: 2 Days Post-Op Procedure(s) (LRB): INTRAMEDULLARY (IM) RETROGRADE FEMORAL NAILING (Right)    Patient reports pain as moderate. No real activity yesterday.  Worried about recovery and rehab time  Objective:   VITALS:   Filed Vitals:   01/16/14 0535  BP: 142/71  Pulse: 105  Temp: 97.6 F (36.4 C)  Resp: 18    Neurovascular intact Incision: dressing C/D/I  LABS  Recent Labs  01/14/14 1727 01/16/14 0410  HGB 11.4* 8.9*  HCT 36.8 30.1*  WBC 9.7 9.0  PLT 246 199     Recent Labs  01/14/14 1727 01/16/14 0410  NA 140 138  K 4.3 4.8  BUN 18 16  CREATININE 0.81 1.17*  GLUCOSE 134* 140*     Recent Labs  01/14/14 1727  INR 0.94     Assessment/Plan: 2 Days Post-Op Procedure(s) (LRB): INTRAMEDULLARY (IM) RETROGRADE FEMORAL NAILING (Right)   Advance diet Up with therapy Discharge to SNF  She will likely require SNF due to non weight bearing status on right leg until able to mobilize safely SW consult placed D/C pending Thursday or Friday

## 2014-01-16 NOTE — Clinical Social Work Psychosocial (Signed)
Clinical Social Work Department  BRIEF PSYCHOSOCIAL ASSESSMENT  Patient:Lindsey Flynn  Account Number: [id number]  Admit date: 01/14/14 Clinical Social Worker Rhea Pink, MSW Date/Time: 01/16/2014  1:30 Pm Referred by: Physician Date Referred: 01/16/2014 Referred for   SNF Placement   Other Referral:  Interview type: Patient at bedside- patient was very loopy Other interview type: PSYCHOSOCIAL DATA  Living Status:Husband Admitted from facility:  Level of care:  Primary support name: Domenica Fail Primary support relationship to patient: Husband Degree of support available:  Strong and vested  CURRENT CONCERNS  Current Concerns   Post-Acute Placement   Other Concerns:  SOCIAL WORK ASSESSMENT / PLAN  CSW met with pt at bedside to offer support and discuss rehab. Patient was slightly loopy but was agreeable to SNF. Sjhe reported that she is going to need rehab prior to returning home. re: PT recommendation for SNF.   Pt lives with her husband  CSW explained placement process and answered questions.   Pt reports no preference at this time    CSW completed FL2 and initiated SNF search.     Assessment/plan status: Information/Referral to [employer]  Other assessment/ plan:  Information/referral to community resources:  SNF   PTAR  PATIENT'S/FAMILY'S RESPONSE TO PLAN OF CARE:  Pt  reports she is agreeable to ST SNF in order to increase strength and independence with mobility prior to returning home  Pt verbalized understanding of placement process and expressed her appreciation for CSW assist. CSW will continue to follow and will assist with all d/c needs  Rhea Pink, MSW, [place]

## 2014-01-16 NOTE — Clinical Social Work Placement (Signed)
Clinical Social Work Department  CLINICAL SOCIAL WORK PLACEMENT NOTE  Patient:Andjela Berton [facility] Account Number: [id number] Admit date: 01/14/14  Clinical Social Worker: Sabino NiemannAmy Marti Acebo LCSWA Date/time: 01/16/2014 3:30 PM  Clinical Social Work is seeking post-discharge placement for this patient at the following level of care: SKILLED NURSING (*CSW will update this form in Epic as items are completed)  01/16/2014 Patient/family provided with [facility] Work's list of facilities offering this level of care within the geographic area requested by the patient (or if unable, by the patient's family).  01/16/2014 Patient/family informed of their freedom to choose among providers that offer the needed level of care, that participate in Medicare, Medicaid or managed care program needed by the patient, have an available bed and are willing to accept the patient.  01/16/2014 Patient/family informed of [facility]' ownership interest in [facility], as well as of the fact that they are under no obligation to receive care at this facility.  PASARR submitted to EDS on  PASARR number received from EDS on  FL2 transmitted to all facilities in geographic area requested by pt/family on 01/16/2014  FL2 transmitted to all facilities within larger geographic area on  Patient informed that his/her managed care company has contracts with or will negotiate with certain facilities, including the following:  Patient/family informed of bed offers received:  Patient chooses bed at  Physician recommends and patient chooses bed at  Patient to be transferred to on  Patient to be transferred to facility by  The following physician request were entered in Epic:  Additional Comments:

## 2014-01-16 NOTE — Evaluation (Signed)
Physical Therapy Evaluation Patient Details Name: Lindsey Flynn MRN: [id number] DOB: 10/25/1940 Today's Date: 01/16/2014   History of Present Illness  pt presents with R Femur fx s/p IM nail.    Clinical Impression  Pt lightheaded and dizzy with sitting EOB.  Pt may need to consider ST-SNF for further rehab prior to returning to home with husband.  Will continue to follow.      Follow Up Recommendations SNF    Equipment Recommendations  Rolling walker with 5" wheels;Wheelchair (measurements PT);Wheelchair cushion (measurements PT);3in1 (PT)    Recommendations for Other Services       Precautions / Restrictions Precautions Precautions: Fall Precaution Comments: Requested clarification on KI use as pt wearing one, however no order for KI.   Required Braces or Orthoses: Knee Immobilizer - Right Restrictions Weight Bearing Restrictions: Yes RLE Weight Bearing: Non weight bearing      Mobility  Bed Mobility Overal bed mobility: Needs Assistance;+2 for physical assistance Bed Mobility: Supine to Sit;Sit to Supine     Supine to sit: Mod assist;+2 for physical assistance;HOB elevated Sit to supine: Max assist;+2 for physical assistance;HOB elevated   General bed mobility comments: pt needs step-by-step cues for sequencing and technique.  Mobility limited by pain.  While sitting EOB pt became dizzy and lightheaded.  pt returned to supine.    Transfers                    Ambulation/Gait                Stairs            Wheelchair Mobility    Modified Rankin (Stroke Patients Only)       Balance Overall balance assessment: Needs assistance Sitting-balance support: Bilateral upper extremity supported;Feet supported Sitting balance-Leahy Scale: Poor Sitting balance - Comments: At this time pt needs UE A for maintaining balance.                                       Pertinent Vitals/Pain 10/10 with mobility.  Premedicated.       Home Living Family/patient expects to be discharged to:: Unsure Living Arrangements: Spouse/significant other               Additional Comments: pt lives with husband and daughter, but unclear if pt will be able to manage at home.  Will need to consider SNF.      Prior Function Level of Independence: Independent               Hand Dominance        Extremity/Trunk Assessment   Upper Extremity Assessment: Defer to OT evaluation           Lower Extremity Assessment: Generalized weakness;RLE deficits/detail RLE Deficits / Details: Did not perform ROM as awaiting clarification on if pt needs to maintain KI for knee extension.  Strength limited by pain.      Cervical / Trunk Assessment: Normal  Communication   Communication: No difficulties  Cognition Arousal/Alertness: Awake/alert Behavior During Therapy: WFL for tasks assessed/performed Overall Cognitive Status: Within Functional Limits for tasks assessed                      General Comments      Exercises        Assessment/Plan    PT Assessment Patient needs continued PT services  PT Diagnosis Difficulty walking;Acute pain   PT Problem List Decreased strength;Decreased activity tolerance;Decreased mobility;Decreased balance;Decreased coordination;Decreased knowledge of use of DME;Decreased knowledge of precautions;Pain;Obesity  PT Treatment Interventions DME instruction;Gait training;Stair training;Functional mobility training;Therapeutic activities;Therapeutic exercise;Balance training;Neuromuscular re-education;Patient/family education   PT Goals (Current goals can be found in the Care Plan section) Acute Rehab PT Goals Patient Stated Goal: Back to normal.   PT Goal Formulation: With patient Time For Goal Achievement: 01/30/14 Potential to Achieve Goals: Good    Frequency Min 5X/week   Barriers to discharge        Co-evaluation               End of Session Equipment  Utilized During Treatment: Oxygen;Right knee immobilizer Activity Tolerance:  (Limited by dizziness) Patient left: in bed;with call bell/phone within reach Nurse Communication: Mobility status (Dizziness)         Time: [id number] PT Time Calculation (min): 31 min   Charges:   PT Evaluation $Initial PT Evaluation Tier I: 1 Procedure PT Treatments $Therapeutic Activity: 8-22 mins   PT G CodesSunny Flynn:          Lindsey Flynn, PT [phone] 01/16/2014, 10:01 AM

## 2014-01-16 NOTE — Anesthesia Postprocedure Evaluation (Signed)
  Anesthesia Post-op Note  Patient: Lindsey Flynn  Procedure(s) Performed: Procedure(s): INTRAMEDULLARY (IM) RETROGRADE FEMORAL NAILING (Right)  Patient Location: PACU  Anesthesia Type:General  Level of Consciousness: awake and alert   Airway and Oxygen Therapy: Patient Spontanous Breathing  Post-op Pain: mild  Post-op Assessment: Post-op Vital signs reviewed, Patient's Cardiovascular Status Stable and Respiratory Function Stable  Post-op Vital Signs: Reviewed  Filed Vitals:   01/16/14 0535  BP: 142/71  Pulse: 105  Temp: 36.4 C  Resp: 18    Complications: No apparent anesthesia complications

## 2014-01-16 NOTE — Progress Notes (Signed)
OT Cancellation Note  Patient Details Name: Lindsey Flynn MRN: [id number] DOB: [birth year]   Cancelled Treatment:    Reason Eval/Treat Not Completed: Other (comment) (defer to SNF)  Pilar GrammesKathryn H Semir Brill 01/16/2014, 4:39 PM

## 2014-01-17 DIAGNOSIS — D62 Acute posthemorrhagic anemia: Secondary | ICD-10-CM

## 2014-01-17 DIAGNOSIS — T84049A Periprosthetic fracture around unspecified internal prosthetic joint, initial encounter: Secondary | ICD-10-CM | POA: Diagnosis not present

## 2014-01-17 DIAGNOSIS — S7290XB Unspecified fracture of unspecified femur, initial encounter for open fracture type I or II: Secondary | ICD-10-CM | POA: Diagnosis not present

## 2014-01-17 LAB — URINALYSIS, ROUTINE W REFLEX MICROSCOPIC
BILIRUBIN URINE: NEGATIVE
GLUCOSE, UA: NEGATIVE mg/dL
Hgb urine dipstick: NEGATIVE
KETONES UR: NEGATIVE mg/dL
LEUKOCYTES UA: NEGATIVE
Nitrite: NEGATIVE
PROTEIN: NEGATIVE mg/dL
Specific Gravity, Urine: 1.011 (ref 1.005–1.030)
Urobilinogen, UA: 0.2 mg/dL (ref 0.0–1.0)
pH: 5.5 (ref 5.0–8.0)

## 2014-01-17 LAB — ABO/RH: ABO/RH(D): O POS

## 2014-01-17 LAB — CBC
HCT: 24.9 % — ABNORMAL LOW (ref 36.0–46.0)
Hemoglobin: 7.8 g/dL — ABNORMAL LOW (ref 12.0–15.0)
MCH: 27.8 pg (ref 26.0–34.0)
MCHC: 31.3 g/dL (ref 30.0–36.0)
MCV: 88.6 fL (ref 78.0–100.0)
Platelets: 183 10*3/uL (ref 150–400)
RBC: 2.81 MIL/uL — ABNORMAL LOW (ref 3.87–5.11)
RDW: 15.1 % (ref 11.5–15.5)
WBC: 6.6 10*3/uL (ref 4.0–10.5)

## 2014-01-17 LAB — BASIC METABOLIC PANEL
BUN: 11 mg/dL (ref 6–23)
CO2: 27 mEq/L (ref 19–32)
Calcium: 8 mg/dL — ABNORMAL LOW (ref 8.4–10.5)
Chloride: 93 mEq/L — ABNORMAL LOW (ref 96–112)
Creatinine, Ser: 0.79 mg/dL (ref 0.50–1.10)
GFR, EST NON AFRICAN AMERICAN: 82 mL/min — AB (ref >90)
GLUCOSE: 154 mg/dL — AB (ref 70–99)
POTASSIUM: 4 meq/L (ref 3.7–5.3)
SODIUM: 133 meq/L — AB (ref 137–147)

## 2014-01-17 LAB — PREPARE RBC (CROSSMATCH)

## 2014-01-17 NOTE — Progress Notes (Signed)
Pt unable to void since I/O cath at 0215 this AM. [facility] and Pt was still unable to void. Bladder scanned and showed 743cc of urine. Notified Dr Charlann Boxerlin and new order obtained to insert foley cath and to send a UA and Cx. New ordered carried out and will continue to monitor Pts urinary status.

## 2014-01-17 NOTE — Progress Notes (Signed)
[facility]  Auth [id number] Rug [employer] RVB Next review date: 01/21/14  Sabino Niemannmy Ineta Sinning, MSW, [employer]WA [phone]

## 2014-01-17 NOTE — Progress Notes (Signed)
Physical Therapy Treatment Patient Details Name: Lindsey Flynn MRN: [id number] DOB: [birth year] Today's Date: 01/17/2014    History of Present Illness pt presents with R Femur fx s/p IM nail.      PT Comments    Pt indicates less dizzy than yesterday, however while sitting on 3-in-1 pt again became dizzy and needed to return to bed.  RN aware.  Throughout OOB mobility pt is unable to maintain NWBing on R LE despite MaxA x2.  Will continue to follow.    Follow Up Recommendations  SNF     Equipment Recommendations   (TBD)    Recommendations for Other Services       Precautions / Restrictions Precautions Precautions: Fall Precaution Comments: Requested clarification on KI use as pt wearing one, however no order for KI.   Required Braces or Orthoses: Knee Immobilizer - Right Restrictions Weight Bearing Restrictions: Yes RLE Weight Bearing: Non weight bearing    Mobility  Bed Mobility Overal bed mobility: Needs Assistance;+2 for physical assistance Bed Mobility: Supine to Sit;Sit to Supine     Supine to sit: Mod assist;+2 for physical assistance Sit to supine: Mod assist;+2 for physical assistance   General bed mobility comments: cues for sequencing and encouragement.    Transfers Overall transfer level: Needs assistance Equipment used: 2 person hand held assist Transfers: Sit to/from [employer] CorporationStand;Stand Pivot Transfers Sit to Stand: Max assist;+2 physical assistance Stand pivot transfers: Max assist;+2 physical assistance       General transfer comment: cues for use of UEs, maintaining NWBing on R LE, and movement through pivot.  pt unable to maintain NWBing on R LE.    Ambulation/Gait                 Stairs            Wheelchair Mobility    Modified Rankin (Stroke Patients Only)       Balance Overall balance assessment: Needs assistance Sitting-balance support: Bilateral upper extremity supported;Feet supported Sitting balance-Leahy Scale:  Poor Sitting balance - Comments: At this time pt needs UE A for maintaining balance.                              Cognition Arousal/Alertness: Awake/alert Behavior During Therapy: WFL for tasks assessed/performed Overall Cognitive Status: Within Functional Limits for tasks assessed                      Exercises      General Comments        Pertinent Vitals/Pain R LE 8/10 during mobility.  Premedicated.      Home Living                      Prior Function            PT Goals (current goals can now be found in the care plan section) Acute Rehab PT Goals Patient Stated Goal: Back to normal.   Time For Goal Achievement: 01/30/14 Potential to Achieve Goals: Good Progress towards PT goals: Progressing toward goals    Frequency  Min 3X/week    PT Plan Frequency needs to be updated    Co-evaluation             End of Session Equipment Utilized During Treatment: Gait belt;Oxygen;Right knee immobilizer Activity Tolerance: Patient limited by fatigue Patient left: in bed;with call bell/phone within reach     Time:  1006-1030 PT Time Calculation (min): 24 min  Charges:  $Therapeutic Activity: 23-37 mins                    G CodesSunny Flynn:      Lindsey Flynn Lindsey Flynn, [place]PT [phone] 01/17/2014, 10:46 AM

## 2014-01-17 NOTE — Progress Notes (Addendum)
Patient ID: Sunday SpillersJean E Asbill, female   DOB: 01/04/1941, 73 y.o.   MRN: [id number]   Subjective: 3 Days Post-Op Procedure(s) (LRB): INTRAMEDULLARY (IM) RETROGRADE FEMORAL NAILING (Right)   Patient reports pain as severe. Patient has not voided since foley discontinued. In and out cath yesterday. Repeated complaint of dizziness and weakness , affecting PT and OT attempts  Objective:   VITALS:   Filed Vitals:   01/17/14 0745  BP:   Pulse:   Temp:   Resp: 18    Sensation intact distally Intact pulses distally Dorsiflexion/Plantar flexion intact Compartment soft  LABS  Recent Labs  01/14/14 1727 01/16/14 0410 01/17/14 0803  HGB 11.4* 8.9* 7.8*  HCT 36.8 30.1* 24.9*  WBC 9.7 9.0 6.6  PLT 246 199 183     Recent Labs  01/14/14 1727 01/16/14 0410 01/17/14 0803  NA 140 138 133*  K 4.3 4.8 4.0  BUN 18 16 11   CREATININE 0.81 1.17* 0.79  GLUCOSE 134* 140* 154*     Assessment/Plan: 3 Days Post-Op Procedure(s) (LRB): INTRAMEDULLARY (IM) RETROGRADE FEMORAL NAILING (Right) Acute blood loss anemia-symptomatic  Advance diet Up with therapy Discharge to SNF  When bed available Will transfuse 2 units PRBCs Replace foley and send UA and Cx   Raj JanusJ. Blair Solenne Manwarren    [facility] [phone]  01/17/2014, 11:11 AM Patient will discharge on doxycycline for prophylactic treatment of open fracture wound.

## 2014-01-17 NOTE — Progress Notes (Signed)
[facility]   Sabino NiemannAmy Roan Sawchuk, MSW, [employer]CSWA [phone]

## 2014-01-18 ENCOUNTER — Encounter (HOSPITAL_COMMUNITY): Payer: Self-pay | Admitting: Orthopedic Surgery

## 2014-01-18 DIAGNOSIS — W19XXXA Unspecified fall, initial encounter: Secondary | ICD-10-CM

## 2014-01-18 DIAGNOSIS — S72409A Unspecified fracture of lower end of unspecified femur, initial encounter for closed fracture: Secondary | ICD-10-CM

## 2014-01-18 LAB — TYPE AND SCREEN
ABO/RH(D): O POS
ANTIBODY SCREEN: NEGATIVE
UNIT DIVISION: 0
Unit division: 0

## 2014-01-18 LAB — URINE CULTURE
COLONY COUNT: NO GROWTH
CULTURE: NO GROWTH

## 2014-01-18 MED ORDER — DOXYCYCLINE HYCLATE 100 MG PO TABS: 100.0000 mg | ORAL_TABLET | Freq: Two times a day (BID) | ORAL | Status: AC

## 2014-01-18 MED ORDER — OXYCODONE HCL 5 MG PO TABS: 5.0000 mg | ORAL_TABLET | ORAL | Status: AC | PRN

## 2014-01-18 MED ORDER — ALUM & MAG HYDROXIDE-SIMETH 200-200-20 MG/5ML PO SUSP: 30.0000 mL | ORAL | Status: AC | PRN

## 2014-01-18 MED ORDER — METOCLOPRAMIDE HCL 5 MG PO TABS: 5.0000 mg | ORAL_TABLET | Freq: Three times a day (TID) | ORAL | Status: AC | PRN

## 2014-01-18 MED ORDER — DSS 100 MG PO CAPS: 100.0000 mg | ORAL_CAPSULE | Freq: Two times a day (BID) | ORAL | Status: AC

## 2014-01-18 MED ORDER — ONDANSETRON HCL 4 MG PO TABS: 4.0000 mg | ORAL_TABLET | Freq: Four times a day (QID) | ORAL | Status: AC | PRN

## 2014-01-18 MED ORDER — ACETAMINOPHEN 500 MG PO TABS: 1000.0000 mg | ORAL_TABLET | Freq: Three times a day (TID) | ORAL | Status: AC | PRN

## 2014-01-18 MED ORDER — FERROUS SULFATE 325 (65 FE) MG PO TABS: 325.0000 mg | ORAL_TABLET | Freq: Three times a day (TID) | ORAL | Status: AC

## 2014-01-18 MED ORDER — DIPHENHYDRAMINE HCL 12.5 MG/5ML PO ELIX: 12.5000 mg | ORAL_SOLUTION | Freq: Four times a day (QID) | ORAL | Status: AC | PRN

## 2014-01-18 MED ORDER — ENOXAPARIN SODIUM 40 MG/0.4ML ~~LOC~~ SOLN: 40.0000 mg | SUBCUTANEOUS | Status: AC

## 2014-01-18 MED ORDER — METHOCARBAMOL 500 MG PO TABS: 500.0000 mg | ORAL_TABLET | Freq: Four times a day (QID) | ORAL | Status: AC | PRN

## 2014-01-18 MED ORDER — POLYETHYLENE GLYCOL 3350 17 G PO PACK: 17.0000 g | PACK | Freq: Two times a day (BID) | ORAL | Status: AC

## 2014-01-18 NOTE — Care Management Note (Signed)
01/18/14 Vance PeperSusan Kaina Orengo, RN BSN Case Manager Patient is difficult to place per Social worker, due to this injury being a liability case. [facility]. Awaiting CIR evaluation..Marland Kitchen

## 2014-01-18 NOTE — Progress Notes (Signed)
Patient ID: Lindsey SpillersJean E Flynn, female   DOB: August 03, 1941, 73 y.o.   MRN: [id number] Subjective: 4 Days Post-Op Procedure(s) (LRB): INTRAMEDULLARY (IM) RETROGRADE FEMORAL NAILING (Right)    Patient reports pain as moderate.  Still challenged with mobility, hence SNF at discharge  Objective:   VITALS:   Filed Vitals:   01/18/14 0538  BP: 111/63  Pulse: 84  Temp: 97.5 F (36.4 C)  Resp: 20    Neurovascular intact Incision: dressing C/D/I  LABS  Recent Labs  01/16/14 0410 01/17/14 0803  HGB 8.9* 7.8*  HCT 30.1* 24.9*  WBC 9.0 6.6  PLT 199 183     Recent Labs  01/16/14 0410 01/17/14 0803  NA 138 133*  K 4.8 4.0  BUN 16 11  CREATININE 1.17* 0.79  GLUCOSE 140* 154*    No results found for this basename: LABPT, INR,  in the last 72 hours   Assessment/Plan: 4 Days Post-Op Procedure(s) (LRB): INTRAMEDULLARY (IM) RETROGRADE FEMORAL NAILING (Right)   Up with therapy Discharge to SNF today  Follow up in 2 weeks Keep dressing dry

## 2014-01-18 NOTE — Discharge Instructions (Signed)
NON weight bearing right lower extremity Maintain knee immobilizer for pain control and to limit knee flexion and thus the stress across the fracture site

## 2014-01-18 NOTE — Plan of Care (Signed)
Problem: Phase I Progression Outcomes Goal: Voiding-avoid urinary catheter unless indicated Outcome: Not Met (add Reason) Foley catheter inserted for urinary retention

## 2014-01-18 NOTE — Consult Note (Signed)
Physical Medicine and Rehabilitation Consult  Reason for Consult: Open  Right femur fracture.  Referring Physician:  Dr. Charlann Boxerlin   HPI: Lindsey Flynn is a [age] y.o. female with history of HTN, OA, who [facility] when a part of  retaining wall fell onto her legs with subsequent open Right Distal periprosthetic Femur Fracture. She was evaluated by Dr. Charlann Boxerlin and underwent I and D of wound with ORIF of right distal femur on the same day. Post op NWB RLE and on Lovenox for DVT prophylaxis.  She has also had problems with urinary retention requiring foley placement as well as ABLA requiring 2 units PRBC.  PT initiated and patient limtied by dizziness with activity as well as difficulty maintaining NWB status.  MD recommending CIR for progression.    Review of Systems  HENT: Negative for hearing loss.   Eyes:       Increase in floaters  Respiratory: Negative for cough and shortness of breath.   Cardiovascular: Positive for leg swelling (RLE>LLE). Negative for chest pain and palpitations.  Gastrointestinal: Positive for constipation. Negative for heartburn and nausea.  Genitourinary: Negative for urgency and frequency.  Musculoskeletal: Positive for back pain, joint pain (Left knee pain and edeama--new) and myalgias.  Neurological: Positive for weakness. Negative for dizziness and headaches.    Past Medical History  Diagnosis Date  . Hypertension   . Arthritis   . Seasonal allergies    Past Surgical History  Procedure Laterality Date  . Uterine fibroid surgery    . Im nailing femoral shaft fracture Right 01/14/2014    dr Charlann Boxerolin  . Joint replacement      rt knee  . Femur im nail Right 01/14/2014    Procedure: INTRAMEDULLARY (IM) RETROGRADE FEMORAL NAILING;  Surgeon: Shelda PalMatthew D Olin, MD;  Location: [facility] OR;  Service: Orthopedics;  Laterality: Right;   Family History  Problem Relation Age of Onset  . Cancer Sister   . Diabetes Other    Social History:    Married. Independent PTA. Retired LawyerCNA. She  reports that she has never smoked. She has never used smokeless tobacco. She reports that she does not drink alcohol or use illicit drugs.  Allergies: No Known Allergies  Medications Prior to Admission  Medication Sig Dispense Refill  . estradiol (ESTRACE) 2 MG tablet Take 2 mg by mouth every other day.       . ibuprofen (ADVIL,MOTRIN) 100 MG tablet Take 200 mg by mouth every 6 (six) hours as needed for pain or fever.      Marland Kitchen. PROAIR HFA 108 (90 BASE) MCG/ACT inhaler Inhale 2 puffs into the lungs daily as needed.      . SYMBICORT 160-4.5 MCG/ACT inhaler Inhale 2 puffs into the lungs 2 (two) times daily.      Marland Kitchen. triamterene-hydrochlorothiazide (MAXZIDE-25) 37.5-25 MG per tablet Take 1 tablet by mouth daily.        Home: Home Living Family/patient expects to be discharged to:: Unsure Living Arrangements: Spouse/significant other Additional Comments: pt lives with husband and daughter, but unclear if pt will be able to manage at home.  Will need to consider SNF.    Functional History: Prior Function Level of Independence: Independent Functional Status:  Mobility: Bed Mobility Overal bed mobility: Needs Assistance;+2 for physical assistance Bed Mobility: Supine to Sit;Sit to Supine Supine to sit: Mod assist;+2 for physical assistance Sit to supine: Mod assist;+2 for physical assistance General bed mobility comments:  cues for sequencing and encouragement.   Transfers Overall transfer level: Needs assistance Equipment used: 2 person hand held assist Transfers: Sit to/from [employer] CorporationStand;Stand Pivot Transfers Sit to Stand: Max assist;+2 physical assistance Stand pivot transfers: Max assist;+2 physical assistance General transfer comment: cues for use of UEs, maintaining NWBing on R LE, and movement through pivot.  pt unable to maintain NWBing on R LE.        ADL:    Cognition: Cognition Overall Cognitive Status: Within Functional Limits for tasks  assessed Orientation Level: Oriented X4 Cognition Arousal/Alertness: Awake/alert Behavior During Therapy: WFL for tasks assessed/performed Overall Cognitive Status: Within Functional Limits for tasks assessed  Blood pressure 111/63, pulse 84, temperature 97.5 F (36.4 C), temperature source Oral, resp. rate 20, height 5\' 7"  (1.702 m), weight 113.399 kg (250 lb), SpO2 98.00%. Physical Exam  Nursing note and vitals reviewed. Constitutional: She is oriented to person, place, and time. She appears well-developed and well-nourished.  HENT:  Head: Normocephalic and atraumatic.  Eyes: Conjunctivae are normal. Pupils are equal, round, and reactive to light.  Neck: Normal range of motion. Neck supple.  Cardiovascular: Normal rate and regular rhythm.   Respiratory: Effort normal and breath sounds normal. No respiratory distress. She has no wheezes.  GI: Soft. Bowel sounds are normal. She exhibits no distension. There is no tenderness.  Musculoskeletal: She exhibits edema. She exhibits no tenderness (left knee, RLE).  Dry dressing right leg with KI in place.  LLE with edema at knee and severe valgus deformity.   Neurological: She is alert and oriented to person, place, and time. No cranial nerve deficit. She exhibits normal muscle tone.  RUE: deltoid 5/5, bicep 5/5, tricep 5/5, wrist ext 5/5, hand intrinsics 5/5 LUE: deltoid 5/5, bicep 5/5, triceps 5/5, wrist ext 5/5, hand instrincs 5/5  RLE 1/5 hf, knee not tested, ankle 4/5.  LLE 1+ to 2hf, 2/5 knee, 4/5 ankle.   No sensory loss  Skin: Skin is warm and dry.  Psychiatric: She has a normal mood and affect. Her behavior is normal. Judgment normal.    Results for orders placed during the hospital encounter of 01/14/14 (from the past 24 hour(s))  PREPARE RBC (CROSSMATCH)     Status: None   Collection Time    01/17/14  1:05 PM      Result Value Ref Range   Order Confirmation ORDER PROCESSED BY BLOOD BANK    TYPE AND SCREEN     Status: None    Collection Time    01/17/14  1:05 PM      Result Value Ref Range   ABO/RH(D) O POS     Antibody Screen NEG     Sample Expiration 01/20/2014     Unit Number [id number]     Blood Component Type RBC LR PHER1     Unit division 00     Status of Unit ISSUED,FINAL     Transfusion Status OK TO TRANSFUSE     Crossmatch Result Compatible     Unit Number [id number]     Blood Component Type RED CELLS,LR     Unit division 00     Status of Unit ISSUED,FINAL     Transfusion Status OK TO TRANSFUSE     Crossmatch Result Compatible    ABO/RH     Status: None   Collection Time    01/17/14  1:05 PM      Result Value Ref Range   ABO/RH(D) O POS     No results found.  Assessment/Plan: Diagnosis: distal right peri-prosthetic femur fx, severe OA left knee 1. Does the need for close, 24 hr/day medical supervision in concert with the patient's rehab needs make it unreasonable for this patient to be served in a less intensive setting? Yes 2. Co-Morbidities requiring supervision/potential complications: pain, ABLA, HTN 3. Due to bladder management, bowel management, safety, skin/wound care, disease management, medication administration, pain management and patient education, does the patient require 24 hr/day rehab nursing? Yes 4. Does the patient require coordinated care of a physician, rehab nurse, PT (1-2 hrs/day, 5 days/week) and OT (1-2 hrs/day, 5 days/week) to address physical and functional deficits in the context of the above medical diagnosis(es)? Yes Addressing deficits in the following areas: balance, endurance, locomotion, strength, transferring, bowel/bladder control, bathing, dressing, feeding, grooming, toileting and psychosocial support 5. Can the patient actively participate in an intensive therapy program of at least 3 hrs of therapy per day at least 5 days per week? Yes 6. The potential for patient to make measurable gains while on inpatient rehab is excellent 7. Anticipated  functional outcomes upon discharge from inpatient rehab are modified independent and supervision  with PT, modified independent, supervision and min assist with OT, n/a with SLP. 8. Estimated rehab length of stay to reach the above functional goals is: 12-15 days 9. Does the patient have adequate social supports to accommodate these discharge functional goals? Yes 10. Anticipated D/C setting: Home 11. Anticipated post D/C treatments: HH therapy and Outpatient therapy 12. Overall Rehab/Functional Prognosis: excellent  RECOMMENDATIONS: This patient's condition is appropriate for continued rehabilitative care in the following setting: CIR Patient has agreed to participate in recommended program. Yes Note that insurance prior authorization may be required for reimbursement for recommended care.  Comment: Rehab Admissions Coordinator to follow up.  Thanks,  Ranelle Oyster, MD, [place] [facility]     01/18/2014

## 2014-01-19 NOTE — Progress Notes (Signed)
Patient ID: Lindsey SpillersJean E Flynn, female   DOB: [birth year], 73 y.o.   MRN: [id number] Subjective: 5 Days Post-Op Procedure(s) (LRB): INTRAMEDULLARY (IM) RETROGRADE FEMORAL NAILING (Right)    Patient reports pain as moderate.  Tolerating course better now.  Does have some left lateral knee pain with activity  Objective:   VITALS:   Filed Vitals:   01/19/14 0700  BP: 129/62  Pulse: 81  Temp: 98.2 F (36.8 C)  Resp: 18    Neurovascular intact Incision: dressing C/D/I  Left LE slight valgus, tender laterally, no lacerations, bruising  LABS  Recent Labs  01/17/14 0803  HGB 7.8*  HCT 24.9*  WBC 6.6  PLT 183     Recent Labs  01/17/14 0803  NA 133*  K 4.0  BUN 11  CREATININE 0.79  GLUCOSE 154*    No results found for this basename: LABPT, INR,  in the last 72 hours   Assessment/Plan: 5 Days Post-Op Procedure(s) (LRB): INTRAMEDULLARY (IM) RETROGRADE FEMORAL NAILING (Right)   Advance diet Up with therapy  D/C plan has changed a bit with consult to CIR - it appears she is a candidate to utilized their services.  D/C day uncertain, maybe Monday Otherwise unchanged Ice to left knee prn for pain

## 2014-01-20 NOTE — Progress Notes (Signed)
Subjective: 6 Days Post-Op Procedure(s) (LRB): INTRAMEDULLARY (IM) RETROGRADE FEMORAL NAILING (Right) Patient reports pain as 3 on 0-10 scale.  Awake and alert reading paper seems comfortable  Objective: Vital signs in last 24 hours: Temp:  [97.9 F (36.6 C)-98 F (36.7 C)] 98 F (36.7 C) (05/03 0531) Pulse Rate:  [75-93] 75 (05/03 0531) Resp:  [16-18] 18 (05/03 0531) BP: (103-125)/(50-65) 117/50 mmHg (05/03 0531) SpO2:  [95 %-100 %] 95 % (05/03 0531)  Intake/Output from previous day: 05/02 0701 - 05/03 0700 In: 360 [P.O.:360] Out: 2150 [Urine:2150] Intake/Output this shift:    No results found for this basename: HGB,  in the last 72 hours No results found for this basename: WBC, RBC, HCT, PLT,  in the last 72 hours No results found for this basename: NA, K, CL, CO2, BUN, CREATININE, GLUCOSE, CALCIUM,  in the last 72 hours No results found for this basename: LABPT, INR,  in the last 72 hours  Incision: dressing C/D/I calf soft moves foot and ankle well.  Assessment/Plan: 6 Days Post-Op Procedure(s) (LRB): INTRAMEDULLARY (IM) RETROGRADE FEMORAL NAILING (Right) Up with therapy Continue current care. Eugenia Mcalpineobert Darin Redmann 01/20/2014, 9:05 AM

## 2014-01-21 NOTE — Progress Notes (Signed)
Occupational Therapy Evaluation Patient Details Name: Lindsey SpillersJean E Fickel MRN: [id number] DOB: [birth year] Today's Date: 01/21/2014    History of Present Illness Pt is 73 y.o Female s/p R IM nail on 01/15/14 for femur fx. Pt now making improved progress with mobility as of 01/21/14.   Clinical Impression   PTA pt lived at home with husband and was independent with ADLs and functional mobility. OT evaluation previously deferred to SNF, however required at this time as pt has made progress and OT evaluation needed to assist with d/c planning. Pt overall at mod A level for transfers, however highly motivated and expect will quickly progress to min A with practice and strengthening of LLE. Feel that pt would benefit from CIR at this time for strengthening and endurance based on progress since PT session on 01/17/14 and high level of motivation in order to increase independence. Pt would continue to benefit from skilled OT to promote independence with ADLs and functional mobility.     Follow Up Recommendations  CIR;Supervision/Assistance - 24 hour    Equipment Recommendations  Other (comment) (Defer to next venue.)    Recommendations for Other Services Rehab consult     Precautions / Restrictions Precautions Precautions: Fall Precaution Comments: Pt wearing KI when OT arrived, however no order for KI. Per PT note, clarification for KI wear has been requested. Required Braces or Orthoses: Knee Immobilizer - Right Restrictions Weight Bearing Restrictions: Yes RLE Weight Bearing: Non weight bearing      Mobility Bed Mobility Overal bed mobility: Needs Assistance Bed Mobility: Supine to Sit     Supine to sit: Min guard;HOB elevated (use of bed rail; pt able to manage RLE off bed)     General bed mobility comments: No VC's required. Pt with good bed mobility with HOB raised and no report of dizziness when sitting EOB.   Transfers Overall transfer level: Needs assistance Equipment used:  Rolling walker (2 wheeled) Transfers: Sit to/from [employer] CorporationStand;Stand Pivot Transfers Sit to Stand: Mod assist Stand pivot transfers: Mod assist       General transfer comment: Pt currently at mod A but expect will progress to Min A for sit<>stand quickly with increased strength in LLE. Pt able to maintain NWB when powering up to stand with R hand on walker and L hand on bed to push up.     Balance Overall balance assessment: Needs assistance Sitting-balance support: No upper extremity supported;Feet supported Sitting balance-Leahy Scale: Fair     Standing balance support: Bilateral upper extremity supported;During functional activity Standing balance-Leahy Scale: Poor Standing balance comment: Pt with use of RW to stand                            ADL Overall ADL's : Needs assistance/impaired Eating/Feeding: Independent;Sitting   Grooming: Wash/dry hands;Wash/dry face;Oral care;Applying deodorant;Set up;Sitting;Supervision/safety (at EOB)   Upper Body Bathing: Supervision/ safety;Set up;Sitting (at EOB)   Lower Body Bathing: Sit to/from stand;Moderate assistance   Upper Body Dressing : Supervision/safety;Set up;Sitting (at EOB)   Lower Body Dressing: Sit to/from stand;Maximal assistance (from EOB)   Toilet Transfer: Moderate assistance;Stand-pivot;RW (from bed>recliner)   Toileting- Clothing Manipulation and Hygiene: Maximal assistance;Sit to/from stand   Tub/ Engineer, structuralhower Transfer: Total assistance     General ADL Comments: Pt requested to peform sponge bath when sitting EOB. Pt at min-Mod A for sit<>stand transfers and completed sit<>stand x3 and stand-pivot from bed to recliner. Pt with improved mobility since PT session  on 01/17/14 and is motivated to participate in therapy.     Vision                     Perception     Praxis Praxis Praxis tested?: Within functional limits    Pertinent Vitals/Pain 2/10, pt reports recent pain medication administration.  Provided pt with ice pack for RLE following session. O2 sats were 96%.     Hand Dominance   Right   Extremity/Trunk Assessment Upper Extremity Assessment Upper Extremity Assessment: Overall WFL for tasks assessed (pt with Bil shoulder arthritis, MMT not completed due to contraindication; however Bil biceps/triceps 5/5 with MMT and shoulders overall WFL for mobility with RW.)   Lower Extremity Assessment Lower Extremity Assessment: Defer to PT evaluation   Cervical / Trunk Assessment Cervical / Trunk Assessment: Normal   Communication Communication Communication: No difficulties   Cognition Arousal/Alertness: Awake/alert Behavior During Therapy: WFL for tasks assessed/performed Overall Cognitive Status: Within Functional Limits for tasks assessed                     General Comments    Pt highly motivated for therapy and for increased independence.             Home Living Family/patient expects to be discharged to:: Inpatient rehab Living Arrangements: Spouse/significant other                                      Prior Functioning/Environment Level of Independence: Independent             OT Diagnosis: Generalized weakness;Acute pain   OT Problem List: Decreased strength;Decreased range of motion;Decreased activity tolerance;Impaired balance (sitting and/or standing);Decreased knowledge of use of DME or AE;Pain;Impaired UE functional use   OT Treatment/Interventions: Self-care/ADL training;Therapeutic exercise;DME and/or AE instruction;Energy conservation;Therapeutic activities;Patient/family education;Balance training    OT Goals(Current goals can be found in the care plan section) Acute Rehab OT Goals Patient Stated Goal: To get stronger to go home OT Goal Formulation: With patient Time For Goal Achievement: 01/28/14 Potential to Achieve Goals: Good ADL Goals Pt Will Perform Grooming: with min assist;standing Pt Will Perform Lower Body  Bathing: with min guard assist;with adaptive equipment;sit to/from stand Pt Will Perform Lower Body Dressing: with min guard assist;with adaptive equipment;sit to/from stand Pt Will Transfer to Toilet: with min guard assist;bedside commode;ambulating Pt Will Perform Toileting - Clothing Manipulation and hygiene: with min assist;sit to/from stand Pt Will Perform Tub/Shower Transfer: Tub transfer;with min assist;ambulating;rolling walker  OT Frequency: Min 2X/week    End of Session Equipment Utilized During Treatment: Rolling walker;Gait belt;Right knee immobilizer Nurse Communication: Other (comment) (pt up in recliner; performed sponge bath and linens changed)  Activity Tolerance: Patient tolerated treatment well Patient left: in chair;with call bell/phone within reach   Time: 0935-1020 OT Time Calculation (min): 45 min Charges:  OT General Charges $OT Visit: 1 Procedure OT Evaluation $Initial OT Evaluation Tier I: 1 Procedure OT Treatments $Self Care/Home Management : 23-37 mins  Rae LipsLeeann M Audreena Sachdeva [phone] 01/21/2014, 10:40 AM

## 2014-01-21 NOTE — Progress Notes (Signed)
CSW continues to follow patient for possible SNF placement, if CIR is unable to take patient.  Maree KrabbeLindsay Bich Mchaney, MSW, Theresia MajorsLCSWA [phone]

## 2014-01-21 NOTE — Progress Notes (Signed)
Rehab admissions - Evaluated for possible admission.  I met with patient and explained inpatient rehab.  I have faxed information to [facility] requesting acute inpatient rehab admission.  I will need OT consult completed as soon as possible so that I can attempt authorization with insurance carrier.  I will let all know once I hear back from insurance carrier.  Call me for questions.  #[phone]

## 2014-01-21 NOTE — Progress Notes (Signed)
Physical Therapy Treatment Patient Details Name: Lindsey Flynn MRN: [id number] DOB: 08/30/1941 Today's Date: 01/21/2014    History of Present Illness Pt is 73 y.o Female s/p R IM nail on 01/15/14 for femur fx. Pt now making improved progress with mobility as of 01/21/14.    PT Comments    Pt demos great progress today with improved mobility and participation.  Feel pt will make great progress with continued therapy.  Will continue to follow.    Follow Up Recommendations  SNF     Equipment Recommendations  Rolling walker with 5" wheels;Wheelchair (measurements PT);Wheelchair cushion (measurements PT);3in1 (PT)    Recommendations for Other Services       Precautions / Restrictions Precautions Precautions: Fall Precaution Comments: No order for KI, however RN left sticky note stating MD wishes KI at all times.   Required Braces or Orthoses: Knee Immobilizer - Right Knee Immobilizer - Right: On at all times Restrictions Weight Bearing Restrictions: Yes RLE Weight Bearing: Non weight bearing    Mobility  Bed Mobility                  Transfers Overall transfer level: Needs assistance Equipment used: Rolling walker (2 wheeled) Transfers: Sit to/from Stand Sit to Stand: Mod assist         General transfer comment: pt much improved with ability to participate with mobility and following directions.  Cues for UE use and maintaining NWBing with coming to stand.    Ambulation/Gait Ambulation/Gait assistance: Min assist Ambulation Distance (Feet): 15 Feet Assistive device: Rolling walker (2 wheeled) Gait Pattern/deviations: Step-to pattern     General Gait Details: pt demos good improvement with maintaining NWBing during amb.  pt fatiges after short distance amb.     Stairs            Wheelchair Mobility    Modified Rankin (Stroke Patients Only)       Balance Overall balance assessment: Needs assistance         Standing balance support: Bilateral  upper extremity supported Standing balance-Leahy Scale: Poor Standing balance comment: pt needs use of Bil UEs to maintain standing.                      Cognition Arousal/Alertness: Awake/alert Behavior During Therapy: WFL for tasks assessed/performed Overall Cognitive Status: Within Functional Limits for tasks assessed                      Exercises      General Comments        Pertinent Vitals/Pain "Much better today."  Premedicated.      Home Living                      Prior Function            PT Goals (current goals can now be found in the care plan section) Acute Rehab PT Goals Patient Stated Goal: To get stronger to go home Time For Goal Achievement: 01/30/14 Potential to Achieve Goals: Good Progress towards PT goals: Progressing toward goals    Frequency  Min 3X/week    PT Plan Current plan remains appropriate    Co-evaluation             End of Session Equipment Utilized During Treatment: Gait belt;Right knee immobilizer Activity Tolerance: Patient tolerated treatment well Patient left: in chair;with call bell/phone within reach;with family/visitor present     Time: [id number]  PT Time Calculation (min): 14 min  Charges:  $Gait Training: 8-22 mins                    G CodesAlison Flynn:      Lindsey Flynn, [place] CarolinaPT [phone] 01/21/2014, 2:37 PM

## 2014-01-21 NOTE — Progress Notes (Signed)
Patient ID: Lindsey SpillersJean E Flynn, female   DOB: 25-Nov-1940, 73 y.o.   MRN: [id number] Subjective: 7 Days Post-Op Procedure(s) (LRB): INTRAMEDULLARY (IM) RETROGRADE FEMORAL NAILING (Right)    Patient reports pain as mild.  Improving just limited due to restrictions and general weakness, deconditioning  Objective:   VITALS:   Filed Vitals:   01/21/14 0459  BP: 128/61  Pulse: 77  Temp: 98 F (36.7 C)  Resp: 18    Neurovascular intact Incision: dressing C/D/I, surgical dressing removed, wound looked great, no problems  LABS No results found for this basename: HGB, HCT, WBC, PLT,  in the last 72 hours  No results found for this basename: NA, K, BUN, CREATININE, GLUCOSE,  in the last 72 hours  No results found for this basename: LABPT, INR,  in the last 72 hours   Assessment/Plan: 7 Days Post-Op Procedure(s) (LRB): INTRAMEDULLARY (IM) RETROGRADE FEMORAL NAILING (Right)   Up with therapy Plan to go to CIR today  Call me with Ortho questions NWB RLE for 6 weeks  Dry dressing to right leg

## 2014-01-22 LAB — URINALYSIS, ROUTINE W REFLEX MICROSCOPIC
Bilirubin Urine: NEGATIVE
Glucose, UA: NEGATIVE mg/dL
Hgb urine dipstick: NEGATIVE
Ketones, ur: NEGATIVE mg/dL
Nitrite: NEGATIVE
Protein, ur: NEGATIVE mg/dL
Specific Gravity, Urine: 1.013 (ref 1.005–1.030)
Urobilinogen, UA: 1 mg/dL (ref 0.0–1.0)
pH: 7.5 (ref 5.0–8.0)

## 2014-01-22 LAB — URINE MICROSCOPIC-ADD ON

## 2014-01-22 NOTE — Care Management Note (Signed)
01/22/14 11:15am Vance PeperSusan Tori Cupps, RN BSN Case Manager spoke with patient concerning not being able to go to CIR. Patient states she would rather go home than a SNF, states she knows what has to be done and she can have therapy at home. patient will have assistance at home. Notified Child psychotherapistsocial worker of change and asked bedside RN to contact Dr. Charlann Boxerlin.

## 2014-01-22 NOTE — Care Management Note (Signed)
01/22/14 1500  Vance PeperSusan Suzana Sohail, RN BSN Case Manager Patient's family states she is not going to have 24/7 assistance. She will not be able to adequately care for herself. Case manager informed social worker, Child psychotherapistocial worker and her leadership are attempting to locate  bed. case manager will continue to monitor.

## 2014-01-22 NOTE — Progress Notes (Signed)
Rehab admissions - I have just received a denial from [facility] for acute inpatient rehab admission.  Per insurance carrier, patient's needs can be met at a lower level of care.  Options will be SNF or home with [facility] therapies as appropriate.  Call me for questions.  #[phone]

## 2014-01-22 NOTE — Progress Notes (Signed)
Physical Therapy Treatment Patient Details Name: Lindsey SpillersJean E Alarid MRN: [id number] DOB: Mar 08, 1941 Today's Date: 01/22/2014    History of Present Illness Pt is 73 y.o Female s/p R IM nail on 01/15/14 for femur fx. Pt now making improved progress with mobility as of 01/21/14.    PT Comments    Spoke with RNCM about uncertainty of pt's D/C plan and what insurance will cover for pt.  Feel CIR would be most beneficial for pt to maximize independence and decrease burden of care.  Will continue to follow.    Follow Up Recommendations  CIR     Equipment Recommendations  Rolling walker with 5" wheels;Wheelchair (measurements PT);Wheelchair cushion (measurements PT);3in1 (PT)    Recommendations for Other Services       Precautions / Restrictions Precautions Precautions: Fall Precaution Comments: No order for KI, however RN left sticky note stating MD wishes KI at all times.   Required Braces or Orthoses: Knee Immobilizer - Right Knee Immobilizer - Right: On at all times Restrictions Weight Bearing Restrictions: Yes RLE Weight Bearing: Non weight bearing    Mobility  Bed Mobility Overal bed mobility: Needs Assistance Bed Mobility: Sit to Supine       Sit to supine: Min assist   General bed mobility comments: A with bringing R LE into bed.    Transfers Overall transfer level: Needs assistance Equipment used: Rolling walker (2 wheeled) Transfers: Sit to/from [employer]Stand;Stand Pivot Transfers Sit to Stand: Min assist Stand pivot transfers: Min assist       General transfer comment: cues for UE use and getting closer to bed prior to sitting.    Ambulation/Gait                 Stairs            Wheelchair Mobility    Modified Rankin (Stroke Patients Only)       Balance Overall balance assessment: Needs assistance         Standing balance support: Bilateral upper extremity supported Standing balance-Leahy Scale: Poor                      Cognition  Arousal/Alertness: Awake/alert Behavior During Therapy: WFL for tasks assessed/performed Overall Cognitive Status: Within Functional Limits for tasks assessed                      Exercises      General Comments        Pertinent Vitals/Pain "Very sore" after sitting in recliner with her foot propped on trash can for extended period of time.      Home Living                      Prior Function            PT Goals (current goals can now be found in the care plan section) Acute Rehab PT Goals Patient Stated Goal: To get stronger to go home Time For Goal Achievement: 01/30/14 Potential to Achieve Goals: Good Progress towards PT goals: Progressing toward goals    Frequency  Min 3X/week    PT Plan Current plan remains appropriate    Co-evaluation             End of Session Equipment Utilized During Treatment: Gait belt;Right knee immobilizer Activity Tolerance: Patient tolerated treatment well Patient left: in bed;with call bell/phone within reach;with family/visitor present     Time: [id number] PT Time Calculation (  min): 13 min  Charges:  $Therapeutic Activity: 8-22 mins                    G CodesSunny Schlein:      Andriana Casa F Merian Wroe, [place]PT [phone] 01/22/2014, 2:43 PM

## 2014-01-22 NOTE — Progress Notes (Signed)
Patient ID: Lindsey SpillersJean E Flynn, female   DOB: 12-06-1940, 73 y.o.   MRN: [id number] Subjective: 8 Days Post-Op Procedure(s) (LRB): INTRAMEDULLARY (IM) RETROGRADE FEMORAL NAILING (Right)    Patient reports pain as mild to moderate.  "Good" day yesterday, able to walk to doorway.  Ready to continue with therapy  Objective:   VITALS:   Filed Vitals:   01/22/14 0621  BP: 129/67  Pulse: 78  Temp: 98.2 F (36.8 C)  Resp: 18    Neurovascular intact Incision: dressing C/D/I  LABS No results found for this basename: HGB, HCT, WBC, PLT,  in the last 72 hours  No results found for this basename: NA, K, BUN, CREATININE, GLUCOSE,  in the last 72 hours  No results found for this basename: LABPT, INR,  in the last 72 hours   Assessment/Plan: 8 Days Post-Op Procedure(s) (LRB): INTRAMEDULLARY (IM) RETROGRADE FEMORAL NAILING (Right)   Discharge to SNF or CIR when ready and accepted SW notify our office [phone] when discharge summary needed  NWB RLE DVT prophylaxis

## 2014-01-23 ENCOUNTER — Encounter: Payer: Self-pay | Admitting: Internal Medicine

## 2014-01-23 NOTE — Discharge Summary (Signed)
Physician Discharge Summary  Patient ID: Lindsey Flynn MRN: [id number] DOB/AGE: [birth year] 71 y.o.  Admit date: 01/14/2014 Discharge date:  01/23/2014  Procedures:  Procedure(s) (LRB): INTRAMEDULLARY (IM) RETROGRADE FEMORAL NAILING (Right)  Attending Physician:  Dr. Durene RomansMatthew Olin   Admission Diagnoses:   Right open femur fracture  Discharge Diagnoses:  Active Problems:   Open fracture of right distal femur   Acute blood loss anemia  Past Medical History  Diagnosis Date  . Hypertension   . Arthritis   . Seasonal allergies     HPI: The history is provided by the patient and the EMS personnel. 73 year old female who presents via EMS for a right open femur fracture. A retaining wall fell onto her legs [place] [facility]. Patient denies head injury or losing consciousness. She currently complains only of pain in the right leg. Pain is constant, and severe. She denies any loss of sensation. Patient has history of prior right knee replacement. EMS reports seeing a piece of bone sticking out of the distal anterior right thigh.Dr Charlann Boxerlin consulted after xrays revealed open fracture distal femur.   PCP: Georgianne FickAMACHANDRAN,AJITH, MD   Discharged Condition: good  Hospital Course:  Patient was admitted to the hospital course on 01/14/2014. Patient was brought to the OR on 01/15/2014 for the above stated procedure.  Patient tolerated the procedure well and brought to the recovery room in good condition and subsequently to the floor.  POD #1 BP: 142/71 ; Pulse: 105 ; Temp: 97.6 F (36.4 C) ; Resp: 18  Patient reports pain as moderate. No real activity yesterday. Worried about recovery and rehab time Neurovascular intact, dorsiflexion/plantar flexion intact, incision: dressing C/D/I, no cellulitis present and compartment soft.   LABS  Basename    HGB  8.9  HCT  30.1   POD #2  BP: 124/58 ; Pulse: 115 ; Temp: 98.2 F (36.8 C) ; Resp: 16  Patient reports pain as severe. Patient  has not voided since foley discontinued. In and out cath yesterday. Repeated complaint of dizziness and weakness , affecting PT and OT attempts Neurovascular intact, dorsiflexion/plantar flexion intact, incision: dressing C/D/I, no cellulitis present and compartment soft.   LABS  Basename    HGB  7.8  HCT  24.9   POD #3  BP: 111/63 ; Pulse: 84 ; Temp: 97.5 F (36.4 C) ; Resp: 20  Patient reports pain as moderate. Still challenged with mobility, hence SNF at discharge Neurovascular intact, dorsiflexion/plantar flexion intact, incision: dressing C/D/I, no cellulitis present and compartment soft.   POD #4 BP: 129/62 ; Pulse: 81 ; Temp: 98.2 F (36.8 C) ; Resp: 18  Patient reports pain as moderate. Tolerating course better now. Does have some left lateral knee pain with activity. Neurovascular intact, dorsiflexion/plantar flexion intact, incision: dressing C/D/I, no cellulitis present and compartment soft.   POD #5 BP: 117/50 ; Pulse: 75 ; Temp: 98 F (36.7 C) ; Resp: 18  Patient reports pain as 3 on 0-10 scale. Awake and alert reading paper seems comfortable Neurovascular intact, dorsiflexion/plantar flexion intact, incision: dressing C/D/I, no cellulitis present and compartment soft.   POD #6 BP: 128/61 ; Pulse: 77 ; Temp: 98 F (36.7 C) ; Resp: 18  Patient reports pain as mild. Improving just limited due to restrictions and general weakness, deconditioning. Neurovascular intact, dorsiflexion/plantar flexion intact, incision: dressing C/D/I, no cellulitis present and compartment soft.   POD #7 BP: 129/67 ; Pulse: 78 ; Temp: 98.2 F (36.8 C) ; Resp:  18  Patient reports pain as mild to moderate. "Good" day yesterday, able to walk to doorway. Ready to continue with therapy. Neurovascular intact, dorsiflexion/plantar flexion intact, incision: dressing C/D/I, no cellulitis present and compartment soft.   POD #8 BP: 122/76 ; Pulse: 89 ; Temp: 98.6 F (37 C) ; Resp: 18  Patient reports  pain as mild, while at rest. Increases with trying to get up. She states that she feels that the left leg is weak and the strength needs to build up in the leg. She is frustrated that she was denied CIR. States that she doesn't have help all the time at home and she will need a place to go. If SW and team can work it out, she can be discharged to SNF today. Neurovascular intact, dorsiflexion/plantar flexion intact, incision: dressing C/D/I, no cellulitis present and compartment soft.   Discharge Exam: General appearance: alert, cooperative and no distress Extremities: Homans sign is negative, no sign of DVT, no edema, redness or tenderness in the calves or thighs and no ulcers, gangrene or trophic changes  Disposition:     Skilled nursing facility with follow up in 2 weeks   Follow-up Information   Follow up with Shelda Pal, MD In 2 weeks.   Specialty:  Orthopedic Surgery   Contact information:   [address] [place] [address] [phone]       Follow up with Shelda Pal, MD. Schedule an appointment as soon as possible for a visit in 2 weeks.   Specialty:  Orthopedic Surgery   Contact information:   [address] [place] [address] [phone]       Discharge Orders   Future Orders Complete By Expires   Call MD / Call 911  As directed    Constipation Prevention  As directed    Diet - low sodium heart healthy  As directed    Discharge instructions  As directed    Discharge instructions  As directed    Increase activity slowly as tolerated  As directed         Medication List         acetaminophen 500 MG tablet  Commonly known as:  TYLENOL  Take 2 tablets (1,000 mg total) by mouth every 8 (eight) hours as needed for mild pain.     alum & mag hydroxide-simeth 200-200-20 MG/5ML suspension  Commonly known as:  MAALOX/MYLANTA  Take 30 mLs by mouth every 4 (four) hours as needed for indigestion.     diphenhydrAMINE 12.5  MG/5ML elixir  Commonly known as:  BENADRYL  Take 5-10 mLs (12.5-25 mg total) by mouth every 6 (six) hours as needed for itching.     doxycycline 100 MG tablet  Commonly known as:  VIBRA-TABS  Take 1 tablet (100 mg total) by mouth every 12 (twelve) hours.     DSS 100 MG Caps  Take 100 mg by mouth 2 (two) times daily.     enoxaparin 40 MG/0.4ML injection  Commonly known as:  LOVENOX  Inject 0.4 mLs (40 mg total) into the skin daily.     estradiol 2 MG tablet  Commonly known as:  ESTRACE  Take 2 mg by mouth every other day.     ferrous sulfate 325 (65 FE) MG tablet  Take 1 tablet (325 mg total) by mouth 3 (three) times daily after meals.     ibuprofen 100 MG tablet  Commonly known as:  ADVIL,MOTRIN  Take 200 mg by  mouth every 6 (six) hours as needed for pain or fever.     methocarbamol 500 MG tablet  Commonly known as:  ROBAXIN  Take 1 tablet (500 mg total) by mouth every 6 (six) hours as needed for muscle spasms.     metoCLOPramide 5 MG tablet  Commonly known as:  REGLAN  Take 1-2 tablets (5-10 mg total) by mouth every 8 (eight) hours as needed for nausea (if ondansetron (ZOFRAN) ineffective.).     ondansetron 4 MG tablet  Commonly known as:  ZOFRAN  Take 1 tablet (4 mg total) by mouth every 6 (six) hours as needed for nausea.     oxyCODONE 5 MG immediate release tablet  Commonly known as:  Oxy IR/ROXICODONE  Take 1-3 tablets (5-15 mg total) by mouth every 4 (four) hours as needed for severe pain.     polyethylene glycol packet  Commonly known as:  MIRALAX / GLYCOLAX  Take 17 g by mouth 2 (two) times daily.     PROAIR HFA 108 (90 BASE) MCG/ACT inhaler  Generic drug:  albuterol  Inhale 2 puffs into the lungs daily as needed.     SYMBICORT 160-4.5 MCG/ACT inhaler  Generic drug:  budesonide-formoterol  Inhale 2 puffs into the lungs 2 (two) times daily.     triamterene-hydrochlorothiazide 37.5-25 MG per tablet  Commonly known as:  MAXZIDE-25  Take 1 tablet by mouth  daily.         Signed: Anastasio AuerbachMatthew S. Jashiya Bassett   PAC  01/23/2014, 7:24 AM

## 2014-01-23 NOTE — Care Management Note (Signed)
CARE MANAGEMENT NOTE 01/23/2014  Patient:  Sunday SpillersBURGESS,Sumayya E   Account Number:  [id number]  Date Initiated:  01/15/2014  Documentation initiated by:  Vance PeperBRADY,Parminder Cupples  Subjective/Objective Assessment:   73 yr old female s/p right open femur fracture requiring IM Nailing.     Action/Plan:   PT/OT eval. CM will continue to monitor.   Anticipated DC Date:  01/23/2014   Anticipated DC Plan:  HOME W HOME HEALTH SERVICES  In-house referral  Clinical Social Worker      DC Associate Professorlanning Services  CM consult      [facility]  HOME HEALTH  DURABLE MEDICAL EQUIPMENT   Choice offered to / List presented to:  C-1 Patient   DME arranged  WALKER - ROLLING  3-N-1  WHEELCHAIR - MANUAL      DME agency  [facility]     HH arranged  HH-2 PT  HH-3 OT  HH-4 NURSE'S AIDE      HH [facility]   Status of service:  Completed, signed off Medicare Important Message given?  YES (If response is "NO", the following Medicare IM given date fields will be blank) Date Medicare IM given:   Date Additional Medicare IM given:    Discharge Disposition:  HOME W HOME HEALTH SERVICES  Per UR Regulation:    If discussed at Long Length of Stay Meetings, dates discussed:    Comments:  01/23/14 11:30am Vance PeperSusan Romelle Muldoon, RN BSN Case Manager Patient's sister has returned from being out of town, states she will assist patient at discharge, patient also states she now wants to go home with her sister's support. Case manager notified bedside RN and [facility] [place], Hens[place].

## 2014-01-23 NOTE — Progress Notes (Signed)
Called Dr Nilsa Nuttinglin's office about pt deciding to go home instead of SNF VintonMatt Baibish PA to be paged by office

## 2014-01-23 NOTE — Progress Notes (Signed)
Physical Therapy Treatment Patient Details Name: Lindsey SpillersJean E Flynn MRN: [id number] DOB: 29-Nov-1940 Today's Date: 01/23/2014    History of Present Illness Pt is 73 y.o Female s/p R IM nail on 01/15/14 for femur fx.     PT Comments    Pt is wanting to go home instead of SNF. Asked pt what she felt like she needed to practice to be able to go   home: left LE exercises as her left leg feels weak, bed mobility, and transfers.  All mobility questions answered to pts satisfaction. Anticipate DC to home later this afternoon via ambulance transport.      Follow Up Recommendations  Home health PT     Equipment Recommendations  Rolling walker with 5" wheels;Wheelchair (measurements PT);Wheelchair cushion (measurements PT);3in1 (PT)    Recommendations for Other Services       Precautions / Restrictions Precautions Precautions: Fall Precaution Comments: No order for KI, however RN left sticky note stating MD wishes KI at all times.   Required Braces or Orthoses: Knee Immobilizer - Right Knee Immobilizer - Right: On at all times Restrictions Weight Bearing Restrictions: Yes RLE Weight Bearing: Non weight bearing    Mobility  Bed Mobility Overal bed mobility: Needs Assistance Bed Mobility: Sit to Supine     Supine to sit: Min assist Sit to supine: Min assist   General bed mobility comments:  (assist with moving RLE )  Transfers Overall transfer level: Needs assistance Equipment used: Rolling walker (2 wheeled) Transfers: Sit to/from Office managertand;Stand Pivot Transfers;Squat Pivot Transfers Sit to Stand: Min assist Stand pivot transfers: Min assist Squat pivot transfers: Min assist (towards left side)     General transfer comment: Cues for safest technique  Ambulation/Gait             General Gait Details: Did not attempt gait this session   Stairs Stairs: Yes       General stair comments: Verbally discussed stairs and how to go up and down with RW, WC  and issued handout.  Also discussed scooting on her bottom in an emergency situation.  Wheelchair Mobility    Modified Rankin (Stroke Patients Only)       Balance                                    Cognition Arousal/Alertness: Awake/alert Behavior During Therapy: WFL for tasks assessed/performed Overall Cognitive Status: Within Functional Limits for tasks assessed                      Exercises General Exercises - Lower Extremity Ankle Circles/Pumps: AROM;Both;10 reps;Supine Short Arc Quad: AROM;Left;5 reps;Supine Heel Slides: Left;5 reps;Supine;AROM Hip ABduction/ADduction: AROM;5 reps;Supine;Left    General Comments General comments (skin integrity, edema, etc.): Pt feels PT has answered all her questions regarding mobility at home.       Pertinent Vitals/Pain C/o of pain in RLE and in left knee with exercise and transfers. Positioned comfortably at end of session.      Home Living                      Prior Function            PT Goals (current goals can now be found in the care plan section) Progress towards PT goals: Progressing toward goals    Frequency       PT Plan Discharge plan needs  to be updated    Co-evaluation             End of Session Equipment Utilized During Treatment: Gait belt;Right knee immobilizer Activity Tolerance: Patient tolerated treatment well Patient left: in bed;with call bell/phone within reach     Time: 1459-1533 PT Time Calculation (min): 34 min  Charges:  $Gait Training: 8-22 mins $Therapeutic Exercise: 8-22 mins                    G CodesRoseanna Rainbow:      Jaycee Mckellips J [facility] 01/23/2014, 3:49 PM Lavona MoundMark Krystle Polcyn, PT  [phone] 01/23/2014

## 2014-01-23 NOTE — Care Management Note (Signed)
CARE MANAGEMENT NOTE 01/23/2014  Patient:  Sunday SpillersBURGESS,Kassidy E   Account Number:  [id number]  Date Initiated:  01/15/2014  Documentation initiated by:  Vance PeperBRADY,Marguis Mathieson  Subjective/Objective Assessment:   73 yr old female s/p right open femur fracture requiring IM Nailing.     Action/Plan:   PT/OT eval. CM will continue to monitor.   Anticipated DC Date:  01/23/2014   Anticipated DC Plan:  SKILLED NURSING FACILITY  In-house referral  Clinical Social Worker      DC Associate Professorlanning Services  CM consult      [facility]  [facility]  DURABLE MEDICAL EQUIPMENT   Choice offered to / List presented to:  C-1 Patient   DME arranged  WALKER - ROLLING  3-N-1  WHEELCHAIR - MANUAL      DME agency  [facility]     HH arranged  HH-2 PT  HH-3 OT  HH-4 NURSE'S AIDE      [facility]   Status of service:  Completed, signed off Medicare Important Message given?   (If response is "NO", the following Medicare IM given date fields will be blank) Date Medicare IM given:   Date Additional Medicare IM given:    Discharge Disposition:  SKILLED NURSING FACILITY  Per UR Regulation:    If discussed at Long Length of Stay Meetings, dates discussed:    Comments:  01/23/14 10:45 Clide CliffSusa Iverna Hammac, RN BSN Case Manager [facility].

## 2014-01-23 NOTE — Progress Notes (Signed)
   Subjective: 9 Days Post-Op Procedure(s) (LRB): INTRAMEDULLARY (IM) RETROGRADE FEMORAL NAILING (Right)   Patient reports pain as mild, while at rest. Increases with trying to get up. She states that she feels that the left leg is weak and the strength needs to build up in the leg.  She is frustrated that she was denied CIR.  States that she doesn't have help all the time at home and she will need a place to go.  If SW and team can work it out, she can be discharged to SNF today.  Objective:   VITALS:   Filed Vitals:   01/23/14 0543  BP: 122/76  Pulse: 89  Temp: 98.6 F (37 C)  Resp: 18    Neurovascular intact Dorsiflexion/Plantar flexion intact Incision: dressing C/D/I No cellulitis present Compartment soft  LABS No new labs  Assessment/Plan: 9 Days Post-Op Procedure(s) (LRB): INTRAMEDULLARY (IM) RETROGRADE FEMORAL NAILING (Right) Maintain knee immobilizer on the right leg. NWB right leg. Up with therapy Discharge to SNF [facility]. Follow up with OLIN,Daren Doswell D in 2 weeks.  Contact information:  [facility] [address] [place] [id number] [phone]        Anastasio AuerbachMatthew S. Drea Jurewicz   PAC  01/23/2014, 7:17 AM

## 2016-02-12 DIAGNOSIS — E1165 Type 2 diabetes mellitus with hyperglycemia: Secondary | ICD-10-CM | POA: Diagnosis not present

## 2016-02-12 DIAGNOSIS — I10 Essential (primary) hypertension: Secondary | ICD-10-CM | POA: Diagnosis not present

## 2016-02-17 DIAGNOSIS — I10 Essential (primary) hypertension: Secondary | ICD-10-CM | POA: Diagnosis not present

## 2016-02-17 DIAGNOSIS — E1165 Type 2 diabetes mellitus with hyperglycemia: Secondary | ICD-10-CM | POA: Diagnosis not present

## 2016-04-12 ENCOUNTER — Ambulatory Visit
Admission: RE | Admit: 2016-04-12 | Discharge: 2016-04-12 | Disposition: A | Discharge status: Home / Self Care | Payer: Medicare Other | Source: Ambulatory Visit | Attending: Internal Medicine | Admitting: Internal Medicine

## 2016-04-12 ENCOUNTER — Other Ambulatory Visit: Payer: Self-pay | Admitting: Internal Medicine

## 2016-04-12 DIAGNOSIS — E1165 Type 2 diabetes mellitus with hyperglycemia: Secondary | ICD-10-CM | POA: Diagnosis not present

## 2016-04-12 DIAGNOSIS — M47816 Spondylosis without myelopathy or radiculopathy, lumbar region: Secondary | ICD-10-CM | POA: Diagnosis not present

## 2016-04-12 DIAGNOSIS — M5442 Lumbago with sciatica, left side: Secondary | ICD-10-CM | POA: Diagnosis not present

## 2016-04-12 DIAGNOSIS — R600 Localized edema: Secondary | ICD-10-CM | POA: Diagnosis not present

## 2016-04-12 DIAGNOSIS — R609 Edema, unspecified: Secondary | ICD-10-CM

## 2016-04-12 DIAGNOSIS — M7989 Other specified soft tissue disorders: Secondary | ICD-10-CM | POA: Diagnosis not present

## 2016-04-19 DIAGNOSIS — M25562 Pain in left knee: Secondary | ICD-10-CM | POA: Diagnosis not present

## 2016-04-19 DIAGNOSIS — M5442 Lumbago with sciatica, left side: Secondary | ICD-10-CM | POA: Diagnosis not present

## 2016-04-19 DIAGNOSIS — N3281 Overactive bladder: Secondary | ICD-10-CM | POA: Diagnosis not present

## 2016-10-11 DIAGNOSIS — Z0000 Encounter for general adult medical examination without abnormal findings: Secondary | ICD-10-CM | POA: Diagnosis not present

## 2016-10-11 DIAGNOSIS — Z780 Asymptomatic menopausal state: Secondary | ICD-10-CM | POA: Diagnosis not present

## 2016-10-11 DIAGNOSIS — E1165 Type 2 diabetes mellitus with hyperglycemia: Secondary | ICD-10-CM | POA: Diagnosis not present

## 2016-10-11 DIAGNOSIS — I10 Essential (primary) hypertension: Secondary | ICD-10-CM | POA: Diagnosis not present

## 2016-10-13 DIAGNOSIS — J4520 Mild intermittent asthma, uncomplicated: Secondary | ICD-10-CM | POA: Diagnosis not present

## 2016-10-13 DIAGNOSIS — E1165 Type 2 diabetes mellitus with hyperglycemia: Secondary | ICD-10-CM | POA: Diagnosis not present

## 2016-10-13 DIAGNOSIS — N3281 Overactive bladder: Secondary | ICD-10-CM | POA: Diagnosis not present

## 2016-10-13 DIAGNOSIS — Z23 Encounter for immunization: Secondary | ICD-10-CM | POA: Diagnosis not present

## 2016-10-13 DIAGNOSIS — I10 Essential (primary) hypertension: Secondary | ICD-10-CM | POA: Diagnosis not present

## 2016-10-18 DIAGNOSIS — H524 Presbyopia: Secondary | ICD-10-CM | POA: Diagnosis not present

## 2016-11-04 ENCOUNTER — Encounter (INDEPENDENT_AMBULATORY_CARE_PROVIDER_SITE_OTHER): Payer: Medicare Other | Admitting: Ophthalmology

## 2016-11-04 DIAGNOSIS — I10 Essential (primary) hypertension: Secondary | ICD-10-CM | POA: Diagnosis not present

## 2016-11-04 DIAGNOSIS — H35033 Hypertensive retinopathy, bilateral: Secondary | ICD-10-CM | POA: Diagnosis not present

## 2016-11-04 DIAGNOSIS — H43813 Vitreous degeneration, bilateral: Secondary | ICD-10-CM | POA: Diagnosis not present

## 2016-11-04 DIAGNOSIS — H353121 Nonexudative age-related macular degeneration, left eye, early dry stage: Secondary | ICD-10-CM

## 2016-11-04 DIAGNOSIS — H2513 Age-related nuclear cataract, bilateral: Secondary | ICD-10-CM | POA: Diagnosis not present

## 2016-11-10 DIAGNOSIS — Z1211 Encounter for screening for malignant neoplasm of colon: Secondary | ICD-10-CM | POA: Diagnosis not present

## 2016-11-10 DIAGNOSIS — Z1212 Encounter for screening for malignant neoplasm of rectum: Secondary | ICD-10-CM | POA: Diagnosis not present

## 2016-11-11 DIAGNOSIS — H2511 Age-related nuclear cataract, right eye: Secondary | ICD-10-CM | POA: Diagnosis not present

## 2016-11-11 DIAGNOSIS — H401132 Primary open-angle glaucoma, bilateral, moderate stage: Secondary | ICD-10-CM | POA: Diagnosis not present

## 2016-11-11 DIAGNOSIS — H2513 Age-related nuclear cataract, bilateral: Secondary | ICD-10-CM | POA: Diagnosis not present

## 2016-11-11 DIAGNOSIS — H5703 Miosis: Secondary | ICD-10-CM | POA: Diagnosis not present

## 2016-11-19 DIAGNOSIS — H401133 Primary open-angle glaucoma, bilateral, severe stage: Secondary | ICD-10-CM | POA: Diagnosis not present

## 2016-11-19 DIAGNOSIS — H25813 Combined forms of age-related cataract, bilateral: Secondary | ICD-10-CM | POA: Diagnosis not present

## 2016-12-06 DIAGNOSIS — H2511 Age-related nuclear cataract, right eye: Secondary | ICD-10-CM | POA: Diagnosis not present

## 2016-12-06 DIAGNOSIS — H409 Unspecified glaucoma: Secondary | ICD-10-CM | POA: Diagnosis not present

## 2016-12-28 DIAGNOSIS — H2512 Age-related nuclear cataract, left eye: Secondary | ICD-10-CM | POA: Diagnosis not present

## 2017-01-17 DIAGNOSIS — H2511 Age-related nuclear cataract, right eye: Secondary | ICD-10-CM | POA: Diagnosis not present

## 2017-01-17 DIAGNOSIS — H25812 Combined forms of age-related cataract, left eye: Secondary | ICD-10-CM | POA: Diagnosis not present

## 2017-02-24 DIAGNOSIS — Z961 Presence of intraocular lens: Secondary | ICD-10-CM | POA: Diagnosis not present

## 2017-03-22 DIAGNOSIS — G8929 Other chronic pain: Secondary | ICD-10-CM | POA: Diagnosis not present

## 2017-03-22 DIAGNOSIS — M069 Rheumatoid arthritis, unspecified: Secondary | ICD-10-CM | POA: Diagnosis not present

## 2017-03-22 DIAGNOSIS — M5442 Lumbago with sciatica, left side: Secondary | ICD-10-CM | POA: Diagnosis not present

## 2017-05-24 DIAGNOSIS — H401133 Primary open-angle glaucoma, bilateral, severe stage: Secondary | ICD-10-CM | POA: Diagnosis not present

## 2017-05-24 DIAGNOSIS — Z961 Presence of intraocular lens: Secondary | ICD-10-CM | POA: Diagnosis not present

## 2017-06-02 DIAGNOSIS — E669 Obesity, unspecified: Secondary | ICD-10-CM | POA: Diagnosis not present

## 2017-06-02 DIAGNOSIS — M0579 Rheumatoid arthritis with rheumatoid factor of multiple sites without organ or systems involvement: Secondary | ICD-10-CM | POA: Diagnosis not present

## 2017-06-02 DIAGNOSIS — M2550 Pain in unspecified joint: Secondary | ICD-10-CM | POA: Diagnosis not present

## 2017-06-02 DIAGNOSIS — R5383 Other fatigue: Secondary | ICD-10-CM | POA: Diagnosis not present

## 2017-06-02 DIAGNOSIS — Z6838 Body mass index (BMI) 38.0-38.9, adult: Secondary | ICD-10-CM | POA: Diagnosis not present

## 2017-06-14 DIAGNOSIS — M150 Primary generalized (osteo)arthritis: Secondary | ICD-10-CM | POA: Diagnosis not present

## 2017-06-14 DIAGNOSIS — Z6837 Body mass index (BMI) 37.0-37.9, adult: Secondary | ICD-10-CM | POA: Diagnosis not present

## 2017-06-14 DIAGNOSIS — M25562 Pain in left knee: Secondary | ICD-10-CM | POA: Diagnosis not present

## 2017-06-14 DIAGNOSIS — M0579 Rheumatoid arthritis with rheumatoid factor of multiple sites without organ or systems involvement: Secondary | ICD-10-CM | POA: Diagnosis not present

## 2017-08-23 DIAGNOSIS — H401133 Primary open-angle glaucoma, bilateral, severe stage: Secondary | ICD-10-CM | POA: Diagnosis not present

## 2017-09-01 DIAGNOSIS — M0579 Rheumatoid arthritis with rheumatoid factor of multiple sites without organ or systems involvement: Secondary | ICD-10-CM | POA: Diagnosis not present

## 2017-09-01 DIAGNOSIS — M150 Primary generalized (osteo)arthritis: Secondary | ICD-10-CM | POA: Diagnosis not present

## 2017-09-01 DIAGNOSIS — M25562 Pain in left knee: Secondary | ICD-10-CM | POA: Diagnosis not present

## 2017-09-01 DIAGNOSIS — Z6838 Body mass index (BMI) 38.0-38.9, adult: Secondary | ICD-10-CM | POA: Diagnosis not present

## 2017-10-20 DIAGNOSIS — H401133 Primary open-angle glaucoma, bilateral, severe stage: Secondary | ICD-10-CM | POA: Diagnosis not present

## 2017-11-16 DIAGNOSIS — E1165 Type 2 diabetes mellitus with hyperglycemia: Secondary | ICD-10-CM | POA: Diagnosis not present

## 2017-11-16 DIAGNOSIS — R5383 Other fatigue: Secondary | ICD-10-CM | POA: Diagnosis not present

## 2017-11-16 DIAGNOSIS — I10 Essential (primary) hypertension: Secondary | ICD-10-CM | POA: Diagnosis not present

## 2017-11-23 DIAGNOSIS — Z0000 Encounter for general adult medical examination without abnormal findings: Secondary | ICD-10-CM | POA: Diagnosis not present

## 2017-11-23 DIAGNOSIS — L03811 Cellulitis of head [any part, except face]: Secondary | ICD-10-CM | POA: Diagnosis not present

## 2017-11-23 DIAGNOSIS — I10 Essential (primary) hypertension: Secondary | ICD-10-CM | POA: Diagnosis not present

## 2017-11-23 DIAGNOSIS — E782 Mixed hyperlipidemia: Secondary | ICD-10-CM | POA: Diagnosis not present

## 2017-11-23 DIAGNOSIS — J4520 Mild intermittent asthma, uncomplicated: Secondary | ICD-10-CM | POA: Diagnosis not present

## 2017-11-23 DIAGNOSIS — E1165 Type 2 diabetes mellitus with hyperglycemia: Secondary | ICD-10-CM | POA: Diagnosis not present

## 2017-11-23 DIAGNOSIS — M069 Rheumatoid arthritis, unspecified: Secondary | ICD-10-CM | POA: Diagnosis not present

## 2017-11-23 DIAGNOSIS — N3281 Overactive bladder: Secondary | ICD-10-CM | POA: Diagnosis not present

## 2017-12-22 DIAGNOSIS — M0579 Rheumatoid arthritis with rheumatoid factor of multiple sites without organ or systems involvement: Secondary | ICD-10-CM | POA: Diagnosis not present

## 2017-12-22 DIAGNOSIS — Z79899 Other long term (current) drug therapy: Secondary | ICD-10-CM | POA: Diagnosis not present

## 2017-12-22 DIAGNOSIS — M25511 Pain in right shoulder: Secondary | ICD-10-CM | POA: Diagnosis not present

## 2017-12-22 DIAGNOSIS — M79643 Pain in unspecified hand: Secondary | ICD-10-CM | POA: Diagnosis not present

## 2018-05-10 DIAGNOSIS — H401133 Primary open-angle glaucoma, bilateral, severe stage: Secondary | ICD-10-CM | POA: Diagnosis not present

## 2018-05-10 DIAGNOSIS — H04123 Dry eye syndrome of bilateral lacrimal glands: Secondary | ICD-10-CM | POA: Diagnosis not present

## 2018-05-28 IMAGING — US US EXTREM LOW VENOUS*L*
1 series · 13 of 24 positions shown · non-contrast
Comparison: None.

CLINICAL DATA: 74-year-old female with a history of left leg
swelling



[Series 1: us extrem low venous*left* · 13 of 38 slices shown]
[im 1/38]
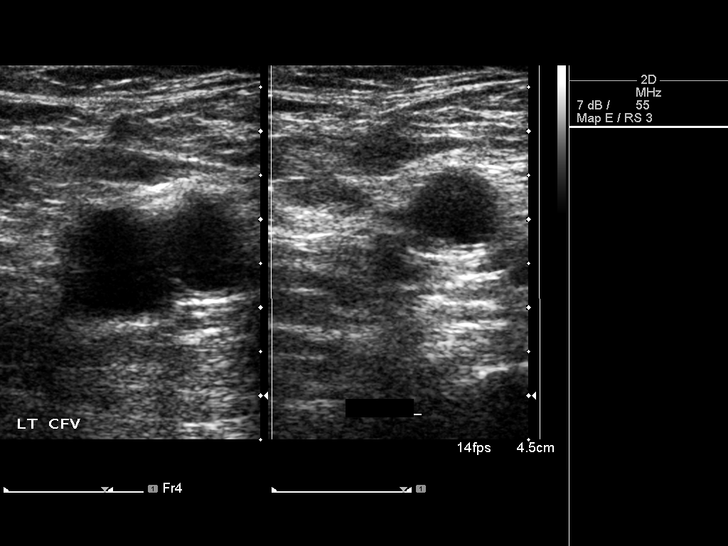
[im 4/38]
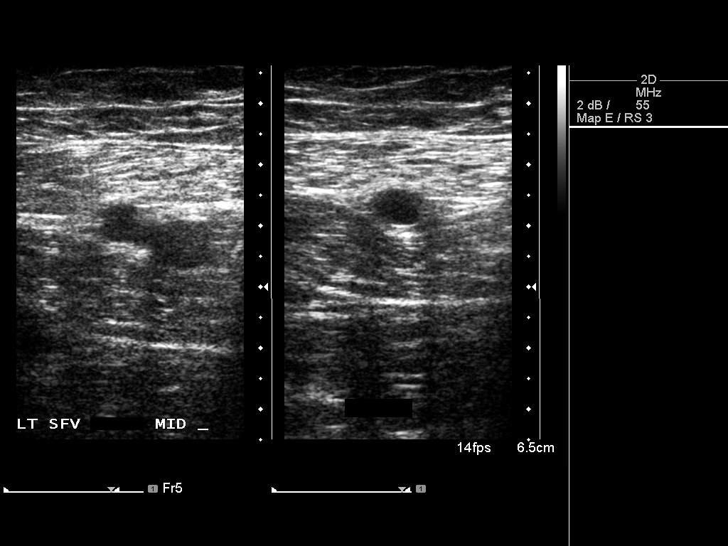
[im 7/38]
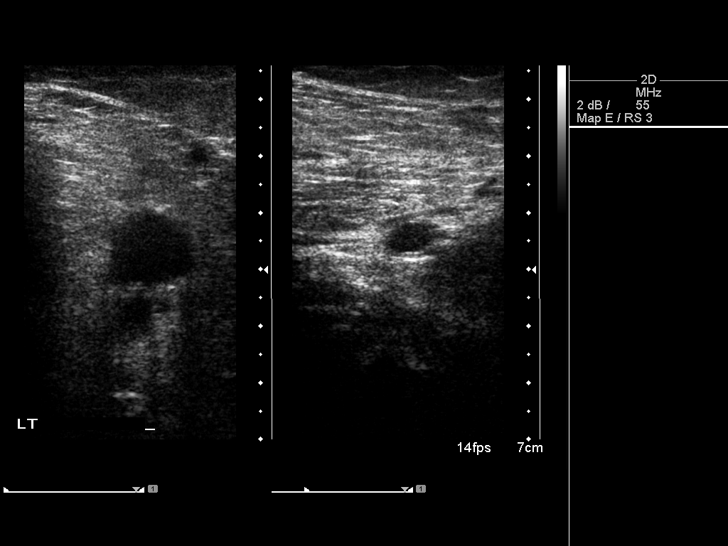
[im 10/38]
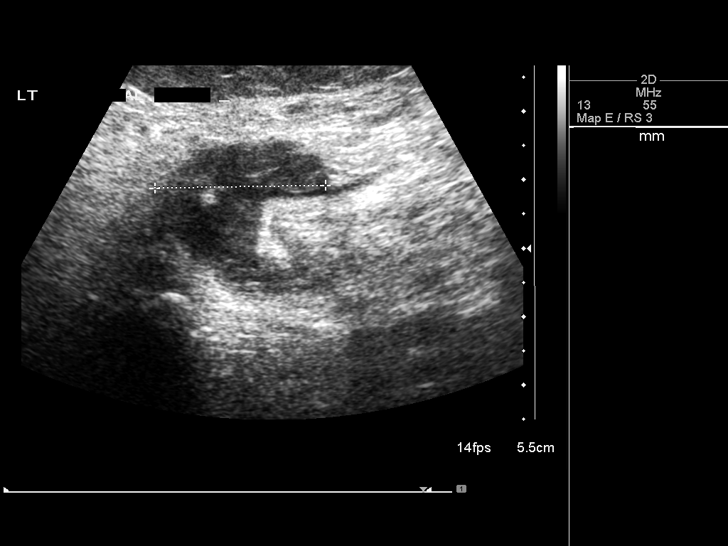
[im 13/38]
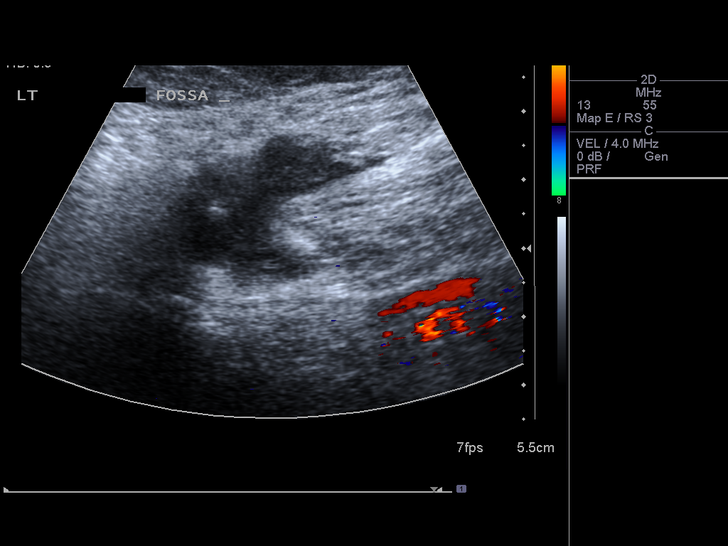
[im 17/38]
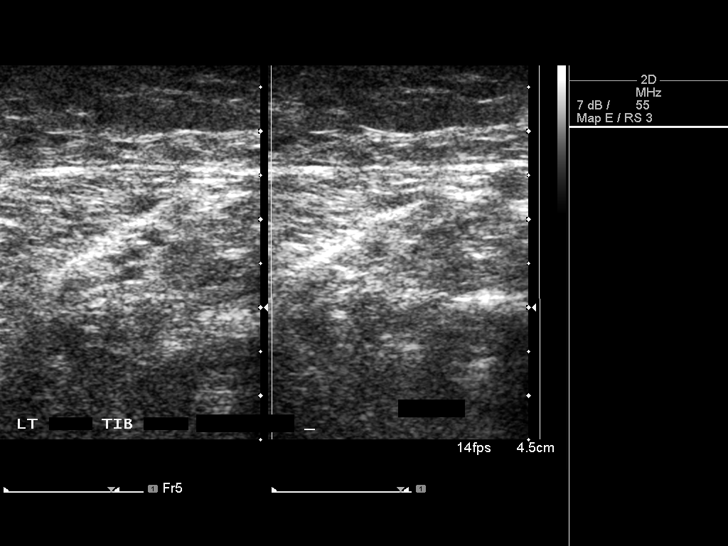
[im 20/38]
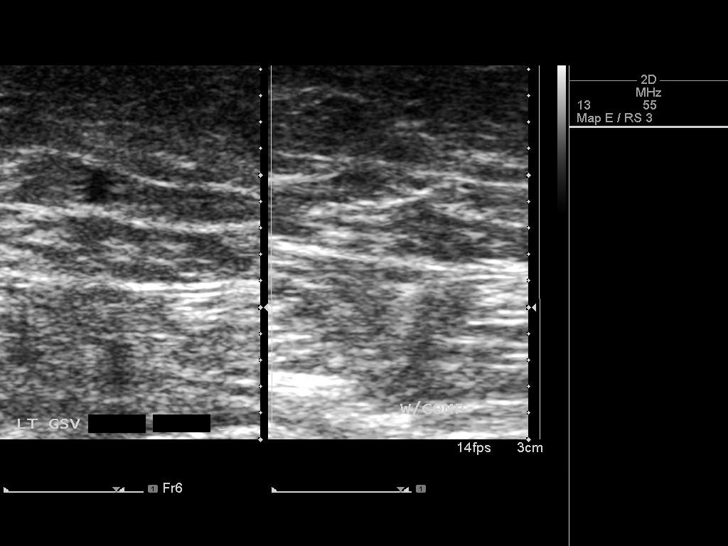
[im 21/38]
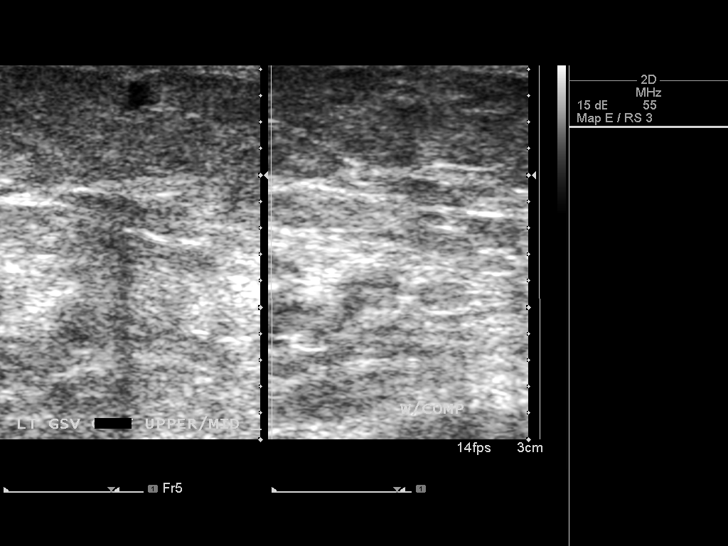
[im 25/38]
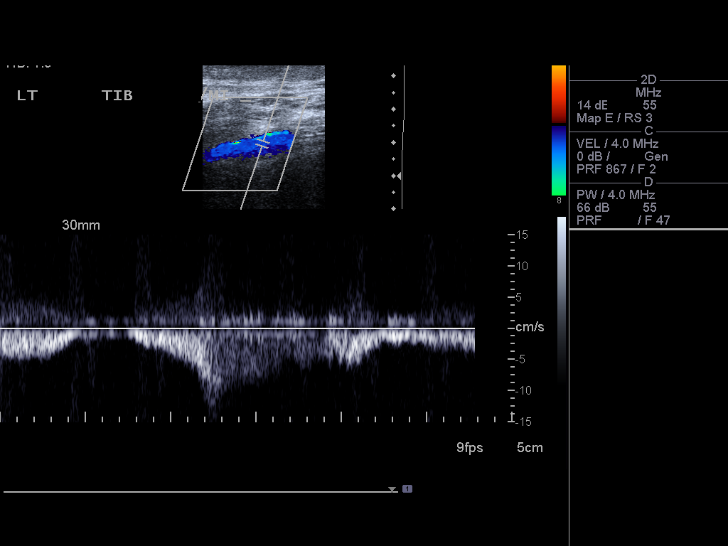
[im 28/38]
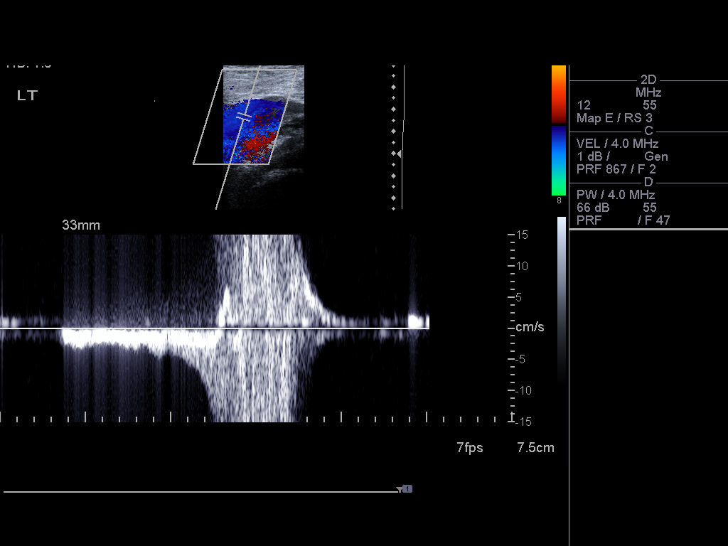
[im 31/38]
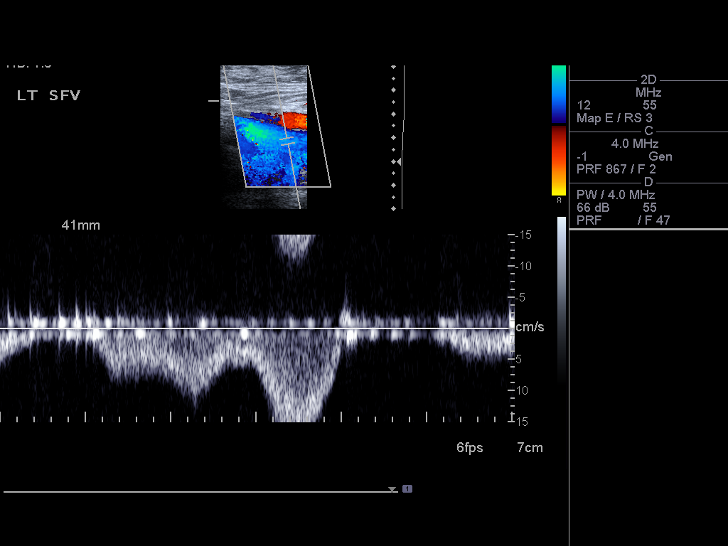
[im 34/38]
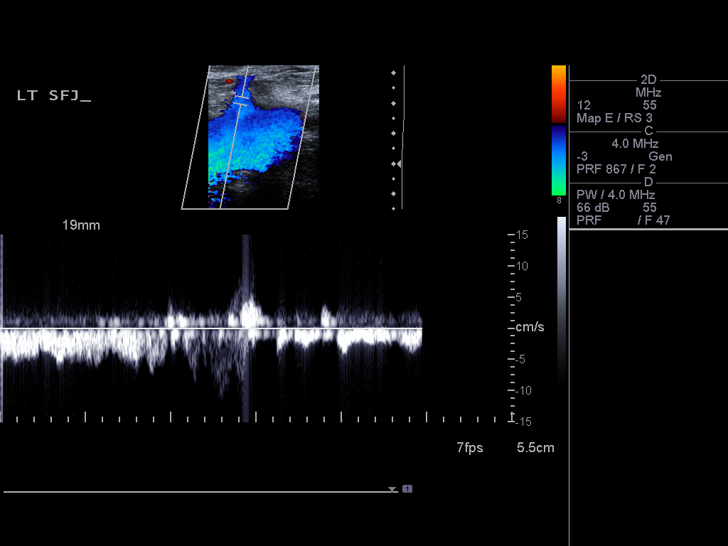
[im 38/38]
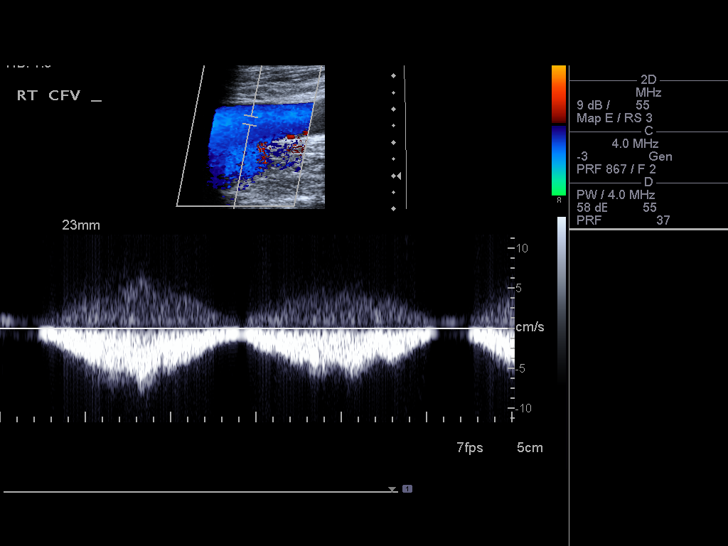

[13 of 24 positions shown; findings below may reference images not displayed]

FINDINGS: Contralateral Common Femoral Vein: Respiratory phasicity is normal
and symmetric with the symptomatic side. No evidence of thrombus.
Normal compressibility.

Common Femoral Vein: No evidence of thrombus. Normal
compressibility, respiratory phasicity and response to augmentation.

Saphenofemoral Junction: No evidence of thrombus. Normal
compressibility and flow on color Doppler imaging.

Profunda Femoral Vein: No evidence of thrombus. Normal
compressibility and flow on color Doppler imaging.

Femoral Vein: No evidence of thrombus. Normal compressibility,
respiratory phasicity and response to augmentation.

Popliteal Vein: No evidence of thrombus. Normal compressibility,
respiratory phasicity and response to augmentation.

Calf Veins: Peroneal vein not visualized. No occlusive thrombus of
the posterior tibial vein.

Superficial Great Saphenous Vein: No evidence of thrombus. Normal
compressibility and flow on color Doppler imaging.

Other Findings: Heterogeneously hypoechoic focus in the left
popliteal fossa measures 4.1 cm x 2.8 cm x 2.5 cm.
IMPRESSION: Sonographic survey of the left lower extremity negative for DVT.

Heterogeneously hypoechoic focus in the left popliteal fossa. The
characteristics are not usual of a simple Baker's cyst. Complex
seroma, hematoma, or infection may also be included in the
differential. Correlation with patient presentation may be useful,
and plain film of the left knee may be considered.

## 2018-06-06 DIAGNOSIS — M1990 Unspecified osteoarthritis, unspecified site: Secondary | ICD-10-CM | POA: Diagnosis not present

## 2018-06-06 DIAGNOSIS — M0579 Rheumatoid arthritis with rheumatoid factor of multiple sites without organ or systems involvement: Secondary | ICD-10-CM | POA: Diagnosis not present

## 2018-06-06 DIAGNOSIS — M79643 Pain in unspecified hand: Secondary | ICD-10-CM | POA: Diagnosis not present

## 2018-06-06 DIAGNOSIS — Z79899 Other long term (current) drug therapy: Secondary | ICD-10-CM | POA: Diagnosis not present

## 2018-07-04 DIAGNOSIS — M79643 Pain in unspecified hand: Secondary | ICD-10-CM | POA: Diagnosis not present

## 2018-07-04 DIAGNOSIS — Z79899 Other long term (current) drug therapy: Secondary | ICD-10-CM | POA: Diagnosis not present

## 2018-07-04 DIAGNOSIS — M1990 Unspecified osteoarthritis, unspecified site: Secondary | ICD-10-CM | POA: Diagnosis not present

## 2018-07-04 DIAGNOSIS — M0579 Rheumatoid arthritis with rheumatoid factor of multiple sites without organ or systems involvement: Secondary | ICD-10-CM | POA: Diagnosis not present

## 2018-08-07 DIAGNOSIS — E1169 Type 2 diabetes mellitus with other specified complication: Secondary | ICD-10-CM | POA: Diagnosis not present

## 2018-08-07 DIAGNOSIS — T783XXA Angioneurotic edema, initial encounter: Secondary | ICD-10-CM | POA: Diagnosis not present

## 2018-08-07 DIAGNOSIS — I10 Essential (primary) hypertension: Secondary | ICD-10-CM | POA: Diagnosis not present

## 2018-08-21 DIAGNOSIS — R600 Localized edema: Secondary | ICD-10-CM | POA: Diagnosis not present

## 2018-08-21 DIAGNOSIS — R635 Abnormal weight gain: Secondary | ICD-10-CM | POA: Diagnosis not present

## 2018-08-21 DIAGNOSIS — J4520 Mild intermittent asthma, uncomplicated: Secondary | ICD-10-CM | POA: Diagnosis not present

## 2018-08-22 ENCOUNTER — Other Ambulatory Visit: Payer: Self-pay | Admitting: Internal Medicine

## 2018-08-22 ENCOUNTER — Other Ambulatory Visit (HOSPITAL_COMMUNITY): Payer: Self-pay | Admitting: Internal Medicine

## 2018-08-22 DIAGNOSIS — R600 Localized edema: Secondary | ICD-10-CM

## 2018-08-23 ENCOUNTER — Ambulatory Visit
Admission: RE | Admit: 2018-08-23 | Discharge: 2018-08-23 | Disposition: A | Discharge status: Home / Self Care | Payer: Medicare Other | Source: Ambulatory Visit | Attending: Internal Medicine | Admitting: Internal Medicine

## 2018-08-23 DIAGNOSIS — R600 Localized edema: Secondary | ICD-10-CM | POA: Diagnosis not present

## 2018-08-24 ENCOUNTER — Ambulatory Visit (HOSPITAL_COMMUNITY): Payer: Medicare Other | Attending: Cardiovascular Disease

## 2018-08-24 ENCOUNTER — Other Ambulatory Visit: Payer: Self-pay

## 2018-08-24 DIAGNOSIS — R600 Localized edema: Secondary | ICD-10-CM | POA: Diagnosis not present

## 2018-08-29 DIAGNOSIS — E1165 Type 2 diabetes mellitus with hyperglycemia: Secondary | ICD-10-CM | POA: Diagnosis not present

## 2018-08-29 DIAGNOSIS — Z79899 Other long term (current) drug therapy: Secondary | ICD-10-CM | POA: Diagnosis not present

## 2018-08-29 DIAGNOSIS — R600 Localized edema: Secondary | ICD-10-CM | POA: Diagnosis not present

## 2018-08-29 DIAGNOSIS — I10 Essential (primary) hypertension: Secondary | ICD-10-CM | POA: Diagnosis not present

## 2018-08-29 DIAGNOSIS — D638 Anemia in other chronic diseases classified elsewhere: Secondary | ICD-10-CM | POA: Diagnosis not present

## 2018-08-31 DIAGNOSIS — M1990 Unspecified osteoarthritis, unspecified site: Secondary | ICD-10-CM | POA: Diagnosis not present

## 2018-08-31 DIAGNOSIS — M0579 Rheumatoid arthritis with rheumatoid factor of multiple sites without organ or systems involvement: Secondary | ICD-10-CM | POA: Diagnosis not present

## 2018-08-31 DIAGNOSIS — Z79899 Other long term (current) drug therapy: Secondary | ICD-10-CM | POA: Diagnosis not present

## 2018-08-31 DIAGNOSIS — M79643 Pain in unspecified hand: Secondary | ICD-10-CM | POA: Diagnosis not present

## 2018-09-04 DIAGNOSIS — I10 Essential (primary) hypertension: Secondary | ICD-10-CM | POA: Diagnosis not present

## 2018-09-04 DIAGNOSIS — E782 Mixed hyperlipidemia: Secondary | ICD-10-CM | POA: Diagnosis not present

## 2018-09-04 DIAGNOSIS — E1169 Type 2 diabetes mellitus with other specified complication: Secondary | ICD-10-CM | POA: Diagnosis not present

## 2018-09-04 DIAGNOSIS — E1165 Type 2 diabetes mellitus with hyperglycemia: Secondary | ICD-10-CM | POA: Diagnosis not present

## 2018-11-03 DIAGNOSIS — S728X9S Other fracture of unspecified femur, sequela: Secondary | ICD-10-CM | POA: Diagnosis not present

## 2018-11-03 DIAGNOSIS — S72409S Unspecified fracture of lower end of unspecified femur, sequela: Secondary | ICD-10-CM | POA: Diagnosis not present

## 2019-01-05 DIAGNOSIS — M79643 Pain in unspecified hand: Secondary | ICD-10-CM | POA: Diagnosis not present

## 2019-01-05 DIAGNOSIS — M1990 Unspecified osteoarthritis, unspecified site: Secondary | ICD-10-CM | POA: Diagnosis not present

## 2019-01-05 DIAGNOSIS — M0579 Rheumatoid arthritis with rheumatoid factor of multiple sites without organ or systems involvement: Secondary | ICD-10-CM | POA: Diagnosis not present

## 2019-01-05 DIAGNOSIS — Z79899 Other long term (current) drug therapy: Secondary | ICD-10-CM | POA: Diagnosis not present

## 2019-05-14 DIAGNOSIS — M79643 Pain in unspecified hand: Secondary | ICD-10-CM | POA: Diagnosis not present

## 2019-05-14 DIAGNOSIS — Z79899 Other long term (current) drug therapy: Secondary | ICD-10-CM | POA: Diagnosis not present

## 2019-05-14 DIAGNOSIS — M0579 Rheumatoid arthritis with rheumatoid factor of multiple sites without organ or systems involvement: Secondary | ICD-10-CM | POA: Diagnosis not present

## 2019-05-14 DIAGNOSIS — M1990 Unspecified osteoarthritis, unspecified site: Secondary | ICD-10-CM | POA: Diagnosis not present

## 2019-07-07 DIAGNOSIS — Z20828 Contact with and (suspected) exposure to other viral communicable diseases: Secondary | ICD-10-CM | POA: Diagnosis not present

## 2019-07-19 DIAGNOSIS — Z23 Encounter for immunization: Secondary | ICD-10-CM | POA: Diagnosis not present

## 2019-08-14 DIAGNOSIS — M79643 Pain in unspecified hand: Secondary | ICD-10-CM | POA: Diagnosis not present

## 2019-08-14 DIAGNOSIS — Z79899 Other long term (current) drug therapy: Secondary | ICD-10-CM | POA: Diagnosis not present

## 2019-08-14 DIAGNOSIS — M0579 Rheumatoid arthritis with rheumatoid factor of multiple sites without organ or systems involvement: Secondary | ICD-10-CM | POA: Diagnosis not present

## 2019-08-14 DIAGNOSIS — M1990 Unspecified osteoarthritis, unspecified site: Secondary | ICD-10-CM | POA: Diagnosis not present

## 2019-09-12 DIAGNOSIS — E1165 Type 2 diabetes mellitus with hyperglycemia: Secondary | ICD-10-CM | POA: Diagnosis not present

## 2019-09-12 DIAGNOSIS — I10 Essential (primary) hypertension: Secondary | ICD-10-CM | POA: Diagnosis not present

## 2019-10-08 DIAGNOSIS — M79643 Pain in unspecified hand: Secondary | ICD-10-CM | POA: Diagnosis not present

## 2019-10-08 DIAGNOSIS — M1990 Unspecified osteoarthritis, unspecified site: Secondary | ICD-10-CM | POA: Diagnosis not present

## 2019-10-08 DIAGNOSIS — M0579 Rheumatoid arthritis with rheumatoid factor of multiple sites without organ or systems involvement: Secondary | ICD-10-CM | POA: Diagnosis not present

## 2019-10-08 DIAGNOSIS — Z79899 Other long term (current) drug therapy: Secondary | ICD-10-CM | POA: Diagnosis not present

## 2019-10-24 DIAGNOSIS — E1165 Type 2 diabetes mellitus with hyperglycemia: Secondary | ICD-10-CM | POA: Diagnosis not present

## 2019-10-24 DIAGNOSIS — E1169 Type 2 diabetes mellitus with other specified complication: Secondary | ICD-10-CM | POA: Diagnosis not present

## 2019-10-24 DIAGNOSIS — E782 Mixed hyperlipidemia: Secondary | ICD-10-CM | POA: Diagnosis not present

## 2019-10-30 DIAGNOSIS — Z0000 Encounter for general adult medical examination without abnormal findings: Secondary | ICD-10-CM | POA: Diagnosis not present

## 2019-10-30 DIAGNOSIS — E1165 Type 2 diabetes mellitus with hyperglycemia: Secondary | ICD-10-CM | POA: Diagnosis not present

## 2019-10-30 DIAGNOSIS — M069 Rheumatoid arthritis, unspecified: Secondary | ICD-10-CM | POA: Diagnosis not present

## 2019-12-06 DIAGNOSIS — M0579 Rheumatoid arthritis with rheumatoid factor of multiple sites without organ or systems involvement: Secondary | ICD-10-CM | POA: Diagnosis not present

## 2019-12-06 DIAGNOSIS — Z79899 Other long term (current) drug therapy: Secondary | ICD-10-CM | POA: Diagnosis not present

## 2019-12-06 DIAGNOSIS — M1990 Unspecified osteoarthritis, unspecified site: Secondary | ICD-10-CM | POA: Diagnosis not present

## 2019-12-06 DIAGNOSIS — M79643 Pain in unspecified hand: Secondary | ICD-10-CM | POA: Diagnosis not present

## 2019-12-13 DIAGNOSIS — M8589 Other specified disorders of bone density and structure, multiple sites: Secondary | ICD-10-CM | POA: Diagnosis not present

## 2019-12-17 DIAGNOSIS — H04123 Dry eye syndrome of bilateral lacrimal glands: Secondary | ICD-10-CM | POA: Diagnosis not present

## 2019-12-17 DIAGNOSIS — H401133 Primary open-angle glaucoma, bilateral, severe stage: Secondary | ICD-10-CM | POA: Diagnosis not present

## 2019-12-17 DIAGNOSIS — H1131 Conjunctival hemorrhage, right eye: Secondary | ICD-10-CM | POA: Diagnosis not present

## 2020-01-11 DIAGNOSIS — H401133 Primary open-angle glaucoma, bilateral, severe stage: Secondary | ICD-10-CM | POA: Diagnosis not present

## 2020-01-11 DIAGNOSIS — H04123 Dry eye syndrome of bilateral lacrimal glands: Secondary | ICD-10-CM | POA: Diagnosis not present

## 2020-02-07 DIAGNOSIS — M79643 Pain in unspecified hand: Secondary | ICD-10-CM | POA: Diagnosis not present

## 2020-02-07 DIAGNOSIS — M0579 Rheumatoid arthritis with rheumatoid factor of multiple sites without organ or systems involvement: Secondary | ICD-10-CM | POA: Diagnosis not present

## 2020-02-07 DIAGNOSIS — Z79899 Other long term (current) drug therapy: Secondary | ICD-10-CM | POA: Diagnosis not present

## 2020-02-07 DIAGNOSIS — M1990 Unspecified osteoarthritis, unspecified site: Secondary | ICD-10-CM | POA: Diagnosis not present

## 2020-04-23 DIAGNOSIS — M1990 Unspecified osteoarthritis, unspecified site: Secondary | ICD-10-CM | POA: Diagnosis not present

## 2020-04-23 DIAGNOSIS — Z79899 Other long term (current) drug therapy: Secondary | ICD-10-CM | POA: Diagnosis not present

## 2020-04-23 DIAGNOSIS — M79643 Pain in unspecified hand: Secondary | ICD-10-CM | POA: Diagnosis not present

## 2020-04-23 DIAGNOSIS — M0579 Rheumatoid arthritis with rheumatoid factor of multiple sites without organ or systems involvement: Secondary | ICD-10-CM | POA: Diagnosis not present

## 2020-05-13 DIAGNOSIS — Z961 Presence of intraocular lens: Secondary | ICD-10-CM | POA: Diagnosis not present

## 2020-05-13 DIAGNOSIS — H04123 Dry eye syndrome of bilateral lacrimal glands: Secondary | ICD-10-CM | POA: Diagnosis not present

## 2020-05-13 DIAGNOSIS — H401133 Primary open-angle glaucoma, bilateral, severe stage: Secondary | ICD-10-CM | POA: Diagnosis not present

## 2020-10-21 DIAGNOSIS — M150 Primary generalized (osteo)arthritis: Secondary | ICD-10-CM | POA: Diagnosis not present

## 2020-10-21 DIAGNOSIS — Z6836 Body mass index (BMI) 36.0-36.9, adult: Secondary | ICD-10-CM | POA: Diagnosis not present

## 2020-10-21 DIAGNOSIS — M0579 Rheumatoid arthritis with rheumatoid factor of multiple sites without organ or systems involvement: Secondary | ICD-10-CM | POA: Diagnosis not present

## 2020-10-21 DIAGNOSIS — E669 Obesity, unspecified: Secondary | ICD-10-CM | POA: Diagnosis not present

## 2021-05-18 DIAGNOSIS — M150 Primary generalized (osteo)arthritis: Secondary | ICD-10-CM | POA: Diagnosis not present

## 2021-05-18 DIAGNOSIS — Z111 Encounter for screening for respiratory tuberculosis: Secondary | ICD-10-CM | POA: Diagnosis not present

## 2021-05-18 DIAGNOSIS — Z6836 Body mass index (BMI) 36.0-36.9, adult: Secondary | ICD-10-CM | POA: Diagnosis not present

## 2021-05-18 DIAGNOSIS — M0579 Rheumatoid arthritis with rheumatoid factor of multiple sites without organ or systems involvement: Secondary | ICD-10-CM | POA: Diagnosis not present

## 2021-10-12 DIAGNOSIS — E782 Mixed hyperlipidemia: Secondary | ICD-10-CM | POA: Diagnosis not present

## 2021-10-12 DIAGNOSIS — R5383 Other fatigue: Secondary | ICD-10-CM | POA: Diagnosis not present

## 2021-10-12 DIAGNOSIS — I10 Essential (primary) hypertension: Secondary | ICD-10-CM | POA: Diagnosis not present

## 2021-10-12 DIAGNOSIS — E1165 Type 2 diabetes mellitus with hyperglycemia: Secondary | ICD-10-CM | POA: Diagnosis not present

## 2021-10-13 DIAGNOSIS — Z0000 Encounter for general adult medical examination without abnormal findings: Secondary | ICD-10-CM | POA: Diagnosis not present

## 2021-10-20 DIAGNOSIS — E1165 Type 2 diabetes mellitus with hyperglycemia: Secondary | ICD-10-CM | POA: Diagnosis not present

## 2021-10-20 DIAGNOSIS — E782 Mixed hyperlipidemia: Secondary | ICD-10-CM | POA: Diagnosis not present

## 2021-10-20 DIAGNOSIS — Z0000 Encounter for general adult medical examination without abnormal findings: Secondary | ICD-10-CM | POA: Diagnosis not present

## 2021-11-03 DIAGNOSIS — M150 Primary generalized (osteo)arthritis: Secondary | ICD-10-CM | POA: Diagnosis not present

## 2021-11-03 DIAGNOSIS — E669 Obesity, unspecified: Secondary | ICD-10-CM | POA: Diagnosis not present

## 2021-11-03 DIAGNOSIS — M0579 Rheumatoid arthritis with rheumatoid factor of multiple sites without organ or systems involvement: Secondary | ICD-10-CM | POA: Diagnosis not present

## 2021-11-03 DIAGNOSIS — Z6835 Body mass index (BMI) 35.0-35.9, adult: Secondary | ICD-10-CM | POA: Diagnosis not present

## 2022-02-09 DIAGNOSIS — E1165 Type 2 diabetes mellitus with hyperglycemia: Secondary | ICD-10-CM | POA: Diagnosis not present

## 2022-02-09 DIAGNOSIS — Z6832 Body mass index (BMI) 32.0-32.9, adult: Secondary | ICD-10-CM | POA: Diagnosis not present

## 2022-02-09 DIAGNOSIS — I10 Essential (primary) hypertension: Secondary | ICD-10-CM | POA: Diagnosis not present

## 2022-02-09 DIAGNOSIS — M1991 Primary osteoarthritis, unspecified site: Secondary | ICD-10-CM | POA: Diagnosis not present

## 2022-02-09 DIAGNOSIS — M0579 Rheumatoid arthritis with rheumatoid factor of multiple sites without organ or systems involvement: Secondary | ICD-10-CM | POA: Diagnosis not present

## 2022-02-09 DIAGNOSIS — E782 Mixed hyperlipidemia: Secondary | ICD-10-CM | POA: Diagnosis not present

## 2022-02-09 DIAGNOSIS — E669 Obesity, unspecified: Secondary | ICD-10-CM | POA: Diagnosis not present

## 2022-02-16 DIAGNOSIS — E782 Mixed hyperlipidemia: Secondary | ICD-10-CM | POA: Diagnosis not present

## 2022-02-16 DIAGNOSIS — E1165 Type 2 diabetes mellitus with hyperglycemia: Secondary | ICD-10-CM | POA: Diagnosis not present

## 2022-02-16 DIAGNOSIS — I10 Essential (primary) hypertension: Secondary | ICD-10-CM | POA: Diagnosis not present

## 2022-06-13 ENCOUNTER — Encounter (HOSPITAL_COMMUNITY): Payer: Self-pay

## 2022-06-13 ENCOUNTER — Emergency Department (HOSPITAL_COMMUNITY): Payer: Medicare Other

## 2022-06-13 ENCOUNTER — Emergency Department (HOSPITAL_COMMUNITY)
Admission: EM | Admit: 2022-06-13 | Discharge: 2022-06-13 | Disposition: A | Discharge status: Home / Self Care | Payer: Medicare Other | Attending: Emergency Medicine | Admitting: Emergency Medicine

## 2022-06-13 DIAGNOSIS — I10 Essential (primary) hypertension: Secondary | ICD-10-CM | POA: Insufficient documentation

## 2022-06-13 DIAGNOSIS — R531 Weakness: Secondary | ICD-10-CM | POA: Diagnosis not present

## 2022-06-13 DIAGNOSIS — R110 Nausea: Secondary | ICD-10-CM | POA: Insufficient documentation

## 2022-06-13 DIAGNOSIS — I6782 Cerebral ischemia: Secondary | ICD-10-CM | POA: Diagnosis not present

## 2022-06-13 DIAGNOSIS — R269 Unspecified abnormalities of gait and mobility: Secondary | ICD-10-CM | POA: Insufficient documentation

## 2022-06-13 DIAGNOSIS — R42 Dizziness and giddiness: Secondary | ICD-10-CM | POA: Diagnosis not present

## 2022-06-13 DIAGNOSIS — Z79899 Other long term (current) drug therapy: Secondary | ICD-10-CM | POA: Insufficient documentation

## 2022-06-13 DIAGNOSIS — H81399 Other peripheral vertigo, unspecified ear: Secondary | ICD-10-CM | POA: Diagnosis not present

## 2022-06-13 DIAGNOSIS — I619 Nontraumatic intracerebral hemorrhage, unspecified: Secondary | ICD-10-CM | POA: Diagnosis not present

## 2022-06-13 DIAGNOSIS — R9082 White matter disease, unspecified: Secondary | ICD-10-CM | POA: Diagnosis not present

## 2022-06-13 LAB — URINALYSIS, ROUTINE W REFLEX MICROSCOPIC
Bilirubin Urine: NEGATIVE
Glucose, UA: NEGATIVE mg/dL
Hgb urine dipstick: NEGATIVE
Ketones, ur: NEGATIVE mg/dL
Leukocytes,Ua: NEGATIVE
Nitrite: NEGATIVE
Protein, ur: NEGATIVE mg/dL
Specific Gravity, Urine: 1.02 (ref 1.005–1.030)
pH: 6 (ref 5.0–8.0)

## 2022-06-13 LAB — CBC
HCT: 42 % (ref 36.0–46.0)
Hemoglobin: 12.6 g/dL (ref 12.0–15.0)
MCH: 30 pg (ref 26.0–34.0)
MCHC: 30 g/dL (ref 30.0–36.0)
MCV: 100 fL (ref 80.0–100.0)
Platelets: 128 10*3/uL — ABNORMAL LOW (ref 150–400)
RBC: 4.2 MIL/uL (ref 3.87–5.11)
RDW: 14.8 % (ref 11.5–15.5)
WBC: 8.8 10*3/uL (ref 4.0–10.5)
nRBC: 0 % (ref 0.0–0.2)

## 2022-06-13 LAB — BASIC METABOLIC PANEL
Anion gap: 6 (ref 5–15)
BUN: 22 mg/dL (ref 8–23)
CO2: 30 mmol/L (ref 22–32)
Calcium: 9.3 mg/dL (ref 8.9–10.3)
Chloride: 106 mmol/L (ref 98–111)
Creatinine, Ser: 0.92 mg/dL (ref 0.44–1.00)
GFR, Estimated: >60 mL/min (ref >60)
Glucose, Bld: 156 mg/dL — ABNORMAL HIGH (ref 70–99)
Potassium: 3.6 mmol/L (ref 3.5–5.1)
Sodium: 142 mmol/L (ref 135–145)

## 2022-06-13 MED ORDER — ONDANSETRON HCL 4 MG PO TABS: 4.0000 mg | ORAL_TABLET | Freq: Four times a day (QID) | ORAL | 0 refills | Status: AC

## 2022-06-13 MED ORDER — MECLIZINE HCL 25 MG PO TABS: 25.0000 mg | ORAL_TABLET | Freq: Three times a day (TID) | ORAL | 0 refills | Status: AC | PRN

## 2022-06-13 MED ORDER — ONDANSETRON 4 MG PO TBDP
4.0000 mg | ORAL_TABLET | Freq: Once | ORAL | Status: AC
  Administered 2022-06-13: 4 mg via ORAL
  Filled 2022-06-13: qty 1

## 2022-06-13 MED ORDER — MECLIZINE HCL 25 MG PO TABS
25.0000 mg | ORAL_TABLET | Freq: Once | ORAL | Status: AC
  Administered 2022-06-13: 25 mg via ORAL
  Filled 2022-06-13: qty 1

## 2022-06-13 NOTE — ED Notes (Addendum)
Pt states she is feeling very dizzy after MRI but needs to urinate. RN notified of dizziness. Bedside commode placed at bedside. Pt assisted with standing, turning and sitting on bedside commode.

## 2022-06-13 NOTE — ED Notes (Addendum)
Pt transported to MRI 

## 2022-06-13 NOTE — Discharge Instructions (Signed)
You were seen in the emergency department for your vertigo.  Your work-up showed no signs of stroke or dehydration or abnormal electrolytes.  This is likely due to peripheral vertigo or your balance crystals in your ears coming loose and cause the dizziness.  You can continue to take meclizine as needed for the dizziness and Zofran as needed for your nausea.  You should follow-up with your primary doctor in the next few days to have your symptoms rechecked.  You should return to the emergency department if you have numbness or weakness on one side of the body compared to the other, you have worsening vertigo that does not get better with the medications, or if you have any other new or concerning symptoms.

## 2022-06-13 NOTE — ED Provider Notes (Signed)
[facility]-EMERGENCY DEPT Provider Note   CSN: [id number] Arrival date & time: 06/13/22  1317     History  Chief Complaint  Patient presents with   Dizziness    Lindsey Flynn is a 81 y.o. female.  Patient is a 81 year old female with a past medical history of hypertension and rheumatoid arthritis presenting to the emergency department with vertigo.  She states that she was at [facility] today when she started to develop room spinning dizziness.  She states that she felt off balance and sat down so that she would not fall.  She states that she has had associated nausea.  She states that she had vertigo once before about 6 years ago and was treated with meclizine.  She denies any associated headaches, vision changes, numbness or weakness.  She denies any ear pain or hearing changes.  She denies any fevers, runny nose or sore throat.  The history is provided by the patient and a relative.  Dizziness      Home Medications Prior to Admission medications   Medication Sig Start Date End Date Taking? Authorizing Provider  meclizine (ANTIVERT) 25 MG tablet Take 1 tablet (25 mg total) by mouth 3 (three) times daily as needed for dizziness. 06/13/22  Yes Wilberta Dorvil, Turkey K, DO  ondansetron (ZOFRAN) 4 MG tablet Take 1 tablet (4 mg total) by mouth every 6 (six) hours. 06/13/22  Yes Shereka Lafortune, Cecile Sheerer, DO  acetaminophen (TYLENOL) 500 MG tablet Take 2 tablets (1,000 mg total) by mouth every 8 (eight) hours as needed for mild pain. 01/18/14   Durene Romans, MD  alum & mag hydroxide-simeth (MAALOX/MYLANTA) 200-200-20 MG/5ML suspension Take 30 mLs by mouth every 4 (four) hours as needed for indigestion. 01/18/14   Durene Romans, MD  diphenhydrAMINE (BENADRYL) 12.5 MG/5ML elixir Take 5-10 mLs (12.5-25 mg total) by mouth every 6 (six) hours as needed for itching. 01/18/14   Durene Romans, MD  docusate sodium 100 MG CAPS Take 100 mg by mouth 2 (two) times daily. 01/18/14   Durene Romans, MD   doxycycline (VIBRA-TABS) 100 MG tablet Take 1 tablet (100 mg total) by mouth every 12 (twelve) hours. 01/18/14   Durene Romans, MD  enoxaparin (LOVENOX) 40 MG/0.4ML injection Inject 0.4 mLs (40 mg total) into the skin daily. 01/18/14   Durene Romans, MD  estradiol (ESTRACE) 2 MG tablet Take 2 mg by mouth every other day.     [provider]  ferrous sulfate 325 (65 FE) MG tablet Take 1 tablet (325 mg total) by mouth 3 (three) times daily after meals. 01/18/14   Durene Romans, MD  ibuprofen (ADVIL,MOTRIN) 100 MG tablet Take 200 mg by mouth every 6 (six) hours as needed for pain or fever.    [provider]  methocarbamol (ROBAXIN) 500 MG tablet Take 1 tablet (500 mg total) by mouth every 6 (six) hours as needed for muscle spasms. 01/18/14   Durene Romans, MD  metoCLOPramide (REGLAN) 5 MG tablet Take 1-2 tablets (5-10 mg total) by mouth every 8 (eight) hours as needed for nausea (if ondansetron (ZOFRAN) ineffective.). 01/18/14   Durene Romans, MD  ondansetron (ZOFRAN) 4 MG tablet Take 1 tablet (4 mg total) by mouth every 6 (six) hours as needed for nausea. 01/18/14   Durene Romans, MD  oxyCODONE (OXY IR/ROXICODONE) 5 MG immediate release tablet Take 1-3 tablets (5-15 mg total) by mouth every 4 (four) hours as needed for severe pain. 01/18/14   Durene Romans, MD  polyethylene glycol (  MIRALAX / GLYCOLAX) packet Take 17 g by mouth 2 (two) times daily. 01/18/14   Durene Romans, MD  PROAIR HFA 108 (90 BASE) MCG/ACT inhaler Inhale 2 puffs into the lungs daily as needed. 12/24/13   [provider]  SYMBICORT 160-4.5 MCG/ACT inhaler Inhale 2 puffs into the lungs 2 (two) times daily. 12/24/13   [provider]  triamterene-hydrochlorothiazide (MAXZIDE-25) 37.5-25 MG per tablet Take 1 tablet by mouth daily. 12/25/13   [provider]      Allergies    Patient has no known allergies.    Review of Systems   Review of Systems  Neurological:  Positive for dizziness.    Physical  Exam Updated Vital Signs BP (!) 161/82 (BP Location: Left Arm)   Pulse 69   Temp 98.4 F (36.9 C) (Oral)   Resp 15   SpO2 100%  Physical Exam Vitals and nursing note reviewed.  Constitutional:      General: She is not in acute distress.    Appearance: Normal appearance.  HENT:     Head: Normocephalic and atraumatic.     Nose: Nose normal.     Mouth/Throat:     Mouth: Mucous membranes are moist.     Pharynx: Oropharynx is clear.  Eyes:     Extraocular Movements: Extraocular movements intact.     Conjunctiva/sclera: Conjunctivae normal.     Pupils: Pupils are equal, round, and reactive to light.     Comments: No nystagmus on exam  Cardiovascular:     Rate and Rhythm: Normal rate and regular rhythm.  Pulmonary:     Effort: Pulmonary effort is normal.     Breath sounds: Normal breath sounds.  Abdominal:     General: Abdomen is flat.     Palpations: Abdomen is soft.     Tenderness: There is no abdominal tenderness.  Musculoskeletal:        General: Normal range of motion.     Cervical back: Normal range of motion and neck supple.  Skin:    General: Skin is warm and dry.  Neurological:     Mental Status: She is alert and oriented to person, place, and time.     Cranial Nerves: No cranial nerve deficit.     Sensory: No sensory deficit.     Motor: No weakness.     Coordination: Coordination normal.     Gait: Gait abnormal (Unsteady).  Psychiatric:        Mood and Affect: Mood normal.        Behavior: Behavior normal.     ED Results / Procedures / Treatments   Labs (all labs ordered are listed, but only abnormal results are displayed) Labs Reviewed  BASIC METABOLIC PANEL - Abnormal; Notable for the following components:      Result Value   Glucose, Bld 156 (*)    All other components within normal limits  CBC - Abnormal; Notable for the following components:   Platelets 128 (*)    All other components within normal limits  URINALYSIS, ROUTINE W REFLEX MICROSCOPIC     EKG EKG Interpretation  Date/Time:  Sunday June 13 2022 19:28:35 EDT Ventricular Rate:  72 PR Interval:  178 QRS Duration: 98 QT Interval:  432 QTC Calculation: 473 R Axis:   50 Text Interpretation: Sinus rhythm No significant change since last tracing Confirmed by Arturo Morton ([id number]) on 06/13/2022 8:18:41 PM  Radiology MR BRAIN WO CONTRAST  Result Date: 06/13/2022 CLINICAL DATA:  Initial evaluation for  acute TIA. EXAM: MRI HEAD WITHOUT CONTRAST TECHNIQUE: Multiplanar, multiecho pulse sequences of the brain and surrounding structures were obtained without intravenous contrast. COMPARISON:  Prior CTs from earlier the same day. FINDINGS: Brain: Cerebral volume within normal limits for age. Scattered patchy T2/FLAIR hyperintensity involving the periventricular a deep white matter both cerebral hemispheres, most characteristic of chronic right vessel ischemic disease, mild in nature. No evidence for acute or subacute ischemia. Gray-white matter differentiation maintained. No areas of chronic cortical infarction. No acute intracranial hemorrhage. Single punctate chronic microhemorrhage noted at the posterior right temporal occipital region, of doubtful significance in isolation. No mass lesion, midline shift or mass effect. No hydrocephalus or extra-axial fluid collection. Pituitary gland and suprasellar region within normal limits. Vascular: Major intracranial vascular flow voids are maintained. Skull and upper cervical spine: Bone marrow signal intensity within normal limits. No scalp soft tissue abnormality. Craniocervical junction within normal limits. Sinuses/Orbits: Prior bilateral ocular lens replacement. Paranasal sinuses are largely clear. Trace right mastoid effusion noted, of doubtful significance. Other: None. IMPRESSION: 1. No acute intracranial abnormality. 2. Mild chronic microvascular ischemic disease for age. Electronically Signed   By: Jeannine Boga M.D.   On:  06/13/2022 20:12   CT Head Wo Contrast  Result Date: 06/13/2022 CLINICAL DATA:  Dizziness and nausea for 2 days. EXAM: CT HEAD WITHOUT CONTRAST TECHNIQUE: Contiguous axial images were obtained from the base of the skull through the vertex without intravenous contrast. RADIATION DOSE REDUCTION: This exam was performed according to the departmental dose-optimization program which includes automated exposure control, adjustment of the mA and/or kV according to patient size and/or use of iterative reconstruction technique. COMPARISON:  01/14/2014 FINDINGS: Brain: No evidence of intracranial hemorrhage, acute infarction, hydrocephalus, extra-axial collection, or mass lesion/mass effect. Interval progression of mild moderate chronic small vessel disease is noted. Vascular:  No hyperdense vessel or other acute findings. Skull: No evidence of fracture or other significant bone abnormality. Sinuses/Orbits:  No acute findings. Other: None. IMPRESSION: No acute intracranial abnormality. Chronic small vessel disease. Electronically Signed   By: Marlaine Hind M.D.   On: 06/13/2022 15:25    Procedures Procedures    Medications Ordered in ED Medications  meclizine (ANTIVERT) tablet 25 mg (25 mg Oral Given 06/13/22 2024)  ondansetron (ZOFRAN-ODT) disintegrating tablet 4 mg (4 mg Oral Given 06/13/22 2024)    ED Course/ Medical Decision Making/ A&P Clinical Course as of 06/13/22 2129  Sun Jun 13, 2022  2106 Urine is negative and MRI without abnormalities. [VK]  2128 Patient's symptoms have improved and she feels stable for discharge home.  She will be given primary care follow-up.  She is with her daughter who is able to help her get home safely. [VK]    Clinical Course User Index [VK] Ottie Glazier, DO                           Medical Decision Making This patient presents to the ED with chief complaint(s) of vertigo with pertinent past medical history of hypertension, rheumatoid arthritis which further  complicates the presenting complaint. The complaint involves an extensive differential diagnosis and also carries with it a high risk of complications and morbidity.    The differential diagnosis includes peripheral versus central vertigo, presyncope less likely though EKG will be performed to evaluate for possible arrhythmia, electrolyte abnormality, dehydration, hypo or hyperglycemia  Additional history obtained: Additional history obtained from family Records reviewed N/A  ED Course and Reassessment:  Upon patient's arrival to the emergency department she is complaining of vertiginous symptoms that had no nystagmus and reproducible symptoms on exam.  She had no focal neurologic deficits but had unsteady gait and will have MRI performed to evaluate for subacute stroke as cause of her vertigo.  Labs were performed by provider in triage which are within normal range.  She was given meclizine and Zofran for symptomatic control and be reassessed.  Independent labs interpretation:  The following labs were independently interpreted: Within normal range  Independent visualization of imaging: - I independently visualized the following imaging with scope of interpretation limited to determining acute life threatening conditions related to emergency care: MRI brain, which revealed no acute abnormality  Consultation: - Consulted or discussed management/test interpretation w/ external professional: N/A  Consideration for admission or further workup: Patient has no emergent conditions requiring admission at this time and is stable Social Determinants of health: N/A    Amount and/or Complexity of Data Reviewed Labs: ordered. Radiology: ordered.  Risk Prescription drug management.          Final Clinical Impression(s) / ED Diagnoses Final diagnoses:  Peripheral vertigo, unspecified laterality    Rx / DC Orders ED Discharge Orders          Ordered    meclizine (ANTIVERT) 25 MG  tablet  3 times daily PRN        06/13/22 2127    ondansetron (ZOFRAN) 4 MG tablet  Every 6 hours        06/13/22 2127              Phoebe Sharps, DO 06/13/22 2129

## 2022-06-13 NOTE — ED Triage Notes (Addendum)
Pt arrived via EMS, picked up from [place], c/o nausea x2 days. Dizziness starting today. Hx of vertigo, states feels same.    Given 4mg  zofran IV

## 2022-06-13 NOTE — ED Provider Triage Note (Addendum)
Emergency Medicine Provider Triage Evaluation Note  Lindsey Flynn , a 81 y.o. female  was evaluated in triage.  Pt complains of dizziness started when she was walking around [place] around noon.  Hx of vertigo, though has not been on prophylactic medication or had a vertigo episode in about 10 years more.  States this feels very similar, though with mild difference at onset.  More nauseous and more difficulty walking than she remembers vertigo episodes to be, though this severity difference only lasted 15-30 minutes.  Denies recent fevers, upper respiratory infection symptoms, chest pain, shortness of breath, urinary symptoms, fall, or syncope.  Not on anticoagulation.  No recent trauma, injury, or neck pain.  Review of Systems  Positive:  Negative: See above  Physical Exam  BP (!) 156/54 (BP Location: Left Arm)   Pulse 63   Temp 97.8 F (36.6 C) (Oral)   Resp 18   SpO2 96%  Gen:   Awake, no distress, appears uncomfortable Resp:  Normal effort  MSK:   Moves extremities without difficulty  Other:  Normal BEFAST exam.  No obvious facial asymmetry.  Sensation and coordination appear grossly intact.  PERRLA.  Normal EOMs.  Gait slow, shuffled, without obvious unsteadiness.  Abdomen soft, nontender.  Medical Decision Making  Medically screening exam initiated at 1:52 PM.  Appropriate orders placed.  Lindsey Flynn was informed that the remainder of the evaluation will be completed by another provider, this initial triage assessment does not replace that evaluation, and the importance of remaining in the ED until their evaluation is complete.     Prince Rome, PA-C 07/14/84 2778    Prince Rome, PA-C 24/23/53 1401

## 2022-06-28 DIAGNOSIS — Z111 Encounter for screening for respiratory tuberculosis: Secondary | ICD-10-CM | POA: Diagnosis not present

## 2022-06-28 DIAGNOSIS — M79642 Pain in left hand: Secondary | ICD-10-CM | POA: Diagnosis not present

## 2022-06-28 DIAGNOSIS — M1991 Primary osteoarthritis, unspecified site: Secondary | ICD-10-CM | POA: Diagnosis not present

## 2022-06-28 DIAGNOSIS — M0579 Rheumatoid arthritis with rheumatoid factor of multiple sites without organ or systems involvement: Secondary | ICD-10-CM | POA: Diagnosis not present

## 2022-07-07 DIAGNOSIS — H8110 Benign paroxysmal vertigo, unspecified ear: Secondary | ICD-10-CM | POA: Diagnosis not present

## 2022-07-27 DIAGNOSIS — Z961 Presence of intraocular lens: Secondary | ICD-10-CM | POA: Diagnosis not present

## 2022-07-27 DIAGNOSIS — H04123 Dry eye syndrome of bilateral lacrimal glands: Secondary | ICD-10-CM | POA: Diagnosis not present

## 2022-07-27 DIAGNOSIS — H401133 Primary open-angle glaucoma, bilateral, severe stage: Secondary | ICD-10-CM | POA: Diagnosis not present

## 2022-09-07 DIAGNOSIS — H903 Sensorineural hearing loss, bilateral: Secondary | ICD-10-CM | POA: Diagnosis not present

## 2022-09-07 DIAGNOSIS — R42 Dizziness and giddiness: Secondary | ICD-10-CM | POA: Diagnosis not present

## 2022-09-29 DIAGNOSIS — R21 Rash and other nonspecific skin eruption: Secondary | ICD-10-CM | POA: Diagnosis not present

## 2022-09-29 DIAGNOSIS — Z111 Encounter for screening for respiratory tuberculosis: Secondary | ICD-10-CM | POA: Diagnosis not present

## 2022-09-29 DIAGNOSIS — M0579 Rheumatoid arthritis with rheumatoid factor of multiple sites without organ or systems involvement: Secondary | ICD-10-CM | POA: Diagnosis not present

## 2022-09-29 DIAGNOSIS — M1991 Primary osteoarthritis, unspecified site: Secondary | ICD-10-CM | POA: Diagnosis not present

## 2022-11-03 DIAGNOSIS — R42 Dizziness and giddiness: Secondary | ICD-10-CM | POA: Diagnosis not present

## 2022-11-10 DIAGNOSIS — H8120 Vestibular neuronitis, unspecified ear: Secondary | ICD-10-CM | POA: Diagnosis not present

## 2022-11-10 DIAGNOSIS — R42 Dizziness and giddiness: Secondary | ICD-10-CM | POA: Diagnosis not present

## 2022-11-10 DIAGNOSIS — H5500 Unspecified nystagmus: Secondary | ICD-10-CM | POA: Diagnosis not present

## 2022-11-10 DIAGNOSIS — E782 Mixed hyperlipidemia: Secondary | ICD-10-CM | POA: Diagnosis not present

## 2022-11-10 DIAGNOSIS — E1122 Type 2 diabetes mellitus with diabetic chronic kidney disease: Secondary | ICD-10-CM | POA: Diagnosis not present

## 2022-11-10 DIAGNOSIS — I10 Essential (primary) hypertension: Secondary | ICD-10-CM | POA: Diagnosis not present

## 2022-11-17 DIAGNOSIS — Z961 Presence of intraocular lens: Secondary | ICD-10-CM | POA: Diagnosis not present

## 2022-11-17 DIAGNOSIS — H04123 Dry eye syndrome of bilateral lacrimal glands: Secondary | ICD-10-CM | POA: Diagnosis not present

## 2022-11-17 DIAGNOSIS — H401133 Primary open-angle glaucoma, bilateral, severe stage: Secondary | ICD-10-CM | POA: Diagnosis not present

## 2022-11-19 ENCOUNTER — Other Ambulatory Visit: Payer: Self-pay | Admitting: Internal Medicine

## 2022-11-19 DIAGNOSIS — R42 Dizziness and giddiness: Secondary | ICD-10-CM

## 2022-11-19 DIAGNOSIS — H5500 Unspecified nystagmus: Secondary | ICD-10-CM

## 2022-11-26 ENCOUNTER — Encounter: Payer: Self-pay | Admitting: Internal Medicine

## 2022-12-01 DIAGNOSIS — E782 Mixed hyperlipidemia: Secondary | ICD-10-CM | POA: Diagnosis not present

## 2022-12-01 DIAGNOSIS — I10 Essential (primary) hypertension: Secondary | ICD-10-CM | POA: Diagnosis not present

## 2022-12-01 DIAGNOSIS — E1165 Type 2 diabetes mellitus with hyperglycemia: Secondary | ICD-10-CM | POA: Diagnosis not present

## 2022-12-01 DIAGNOSIS — Z0000 Encounter for general adult medical examination without abnormal findings: Secondary | ICD-10-CM | POA: Diagnosis not present

## 2022-12-02 DIAGNOSIS — E782 Mixed hyperlipidemia: Secondary | ICD-10-CM | POA: Diagnosis not present

## 2022-12-02 DIAGNOSIS — Z0000 Encounter for general adult medical examination without abnormal findings: Secondary | ICD-10-CM | POA: Diagnosis not present

## 2022-12-02 DIAGNOSIS — I10 Essential (primary) hypertension: Secondary | ICD-10-CM | POA: Diagnosis not present

## 2022-12-02 DIAGNOSIS — E1165 Type 2 diabetes mellitus with hyperglycemia: Secondary | ICD-10-CM | POA: Diagnosis not present

## 2022-12-09 ENCOUNTER — Inpatient Hospital Stay: Admission: RE | Admit: 2022-12-09 | Payer: Medicare Other | Source: Ambulatory Visit

## 2023-05-31 DIAGNOSIS — R5383 Other fatigue: Secondary | ICD-10-CM | POA: Diagnosis not present

## 2023-05-31 DIAGNOSIS — M0579 Rheumatoid arthritis with rheumatoid factor of multiple sites without organ or systems involvement: Secondary | ICD-10-CM | POA: Diagnosis not present

## 2023-05-31 DIAGNOSIS — Z111 Encounter for screening for respiratory tuberculosis: Secondary | ICD-10-CM | POA: Diagnosis not present

## 2023-05-31 DIAGNOSIS — M1991 Primary osteoarthritis, unspecified site: Secondary | ICD-10-CM | POA: Diagnosis not present

## 2023-05-31 DIAGNOSIS — R21 Rash and other nonspecific skin eruption: Secondary | ICD-10-CM | POA: Diagnosis not present

## 2023-09-08 DIAGNOSIS — R21 Rash and other nonspecific skin eruption: Secondary | ICD-10-CM | POA: Diagnosis not present

## 2023-09-08 DIAGNOSIS — M25559 Pain in unspecified hip: Secondary | ICD-10-CM | POA: Diagnosis not present

## 2023-09-08 DIAGNOSIS — R5383 Other fatigue: Secondary | ICD-10-CM | POA: Diagnosis not present

## 2023-09-08 DIAGNOSIS — M79641 Pain in right hand: Secondary | ICD-10-CM | POA: Diagnosis not present

## 2023-09-08 DIAGNOSIS — M1991 Primary osteoarthritis, unspecified site: Secondary | ICD-10-CM | POA: Diagnosis not present

## 2023-09-08 DIAGNOSIS — M0579 Rheumatoid arthritis with rheumatoid factor of multiple sites without organ or systems involvement: Secondary | ICD-10-CM | POA: Diagnosis not present

## 2023-09-08 DIAGNOSIS — M79642 Pain in left hand: Secondary | ICD-10-CM | POA: Diagnosis not present

## 2023-09-08 DIAGNOSIS — M545 Low back pain, unspecified: Secondary | ICD-10-CM | POA: Diagnosis not present

## 2023-11-02 ENCOUNTER — Ambulatory Visit: Payer: Medicare Other | Admitting: Dermatology

## 2023-12-06 DIAGNOSIS — R54 Age-related physical debility: Secondary | ICD-10-CM | POA: Diagnosis not present

## 2023-12-06 DIAGNOSIS — M069 Rheumatoid arthritis, unspecified: Secondary | ICD-10-CM | POA: Diagnosis not present

## 2023-12-16 DIAGNOSIS — I10 Essential (primary) hypertension: Secondary | ICD-10-CM | POA: Diagnosis not present

## 2023-12-22 DIAGNOSIS — I10 Essential (primary) hypertension: Secondary | ICD-10-CM | POA: Diagnosis not present

## 2023-12-22 DIAGNOSIS — M069 Rheumatoid arthritis, unspecified: Secondary | ICD-10-CM | POA: Diagnosis not present

## 2024-01-10 DIAGNOSIS — J302 Other seasonal allergic rhinitis: Secondary | ICD-10-CM | POA: Diagnosis not present

## 2024-01-16 DIAGNOSIS — I10 Essential (primary) hypertension: Secondary | ICD-10-CM | POA: Diagnosis not present

## 2024-01-19 DIAGNOSIS — R062 Wheezing: Secondary | ICD-10-CM | POA: Diagnosis not present

## 2024-02-06 DIAGNOSIS — I10 Essential (primary) hypertension: Secondary | ICD-10-CM | POA: Diagnosis not present

## 2024-02-28 DIAGNOSIS — M069 Rheumatoid arthritis, unspecified: Secondary | ICD-10-CM | POA: Diagnosis not present

## 2024-03-12 DIAGNOSIS — I10 Essential (primary) hypertension: Secondary | ICD-10-CM | POA: Diagnosis not present

## 2024-05-04 DIAGNOSIS — I10 Essential (primary) hypertension: Secondary | ICD-10-CM | POA: Diagnosis not present

## 2024-05-04 DIAGNOSIS — R42 Dizziness and giddiness: Secondary | ICD-10-CM | POA: Diagnosis not present

## 2024-05-04 DIAGNOSIS — Z1331 Encounter for screening for depression: Secondary | ICD-10-CM | POA: Diagnosis not present

## 2024-05-10 DIAGNOSIS — Z1331 Encounter for screening for depression: Secondary | ICD-10-CM | POA: Diagnosis not present

## 2024-05-10 DIAGNOSIS — Z0000 Encounter for general adult medical examination without abnormal findings: Secondary | ICD-10-CM | POA: Diagnosis not present

## 2024-05-18 DIAGNOSIS — I10 Essential (primary) hypertension: Secondary | ICD-10-CM | POA: Diagnosis not present

## 2024-05-31 ENCOUNTER — Ambulatory Visit: Payer: Medicare Other | Admitting: Dermatology

## 2024-06-09 DIAGNOSIS — F419 Anxiety disorder, unspecified: Secondary | ICD-10-CM | POA: Diagnosis not present

## 2024-07-05 DIAGNOSIS — I10 Essential (primary) hypertension: Secondary | ICD-10-CM | POA: Diagnosis not present

## 2024-07-12 DIAGNOSIS — L209 Atopic dermatitis, unspecified: Secondary | ICD-10-CM | POA: Diagnosis not present

## 2024-08-17 DIAGNOSIS — I10 Essential (primary) hypertension: Secondary | ICD-10-CM | POA: Diagnosis not present
